# Patient Record
Sex: Male | Born: 1950 | Race: Black or African American | Hispanic: No | Smoking: Former smoker
Health system: Southern US, Community
[De-identification: ages and names within clinical notes are randomized; demographics above are authoritative.]

## PROBLEM LIST (undated history)

## (undated) DIAGNOSIS — E785 Hyperlipidemia, unspecified: Secondary | ICD-10-CM

## (undated) DIAGNOSIS — J189 Pneumonia, unspecified organism: Secondary | ICD-10-CM

## (undated) DIAGNOSIS — E119 Type 2 diabetes mellitus without complications: Secondary | ICD-10-CM

## (undated) DIAGNOSIS — I10 Essential (primary) hypertension: Secondary | ICD-10-CM

## (undated) HISTORY — DX: Essential (primary) hypertension: I10

## (undated) HISTORY — DX: Type 2 diabetes mellitus without complications: E11.9

## (undated) HISTORY — DX: Hyperlipidemia, unspecified: E78.5

---

## 1999-02-19 ENCOUNTER — Ambulatory Visit (HOSPITAL_COMMUNITY): Admission: RE | Admit: 1999-02-19 | Discharge: 1999-02-19 | Payer: Self-pay | Admitting: Family Medicine

## 1999-11-03 ENCOUNTER — Ambulatory Visit (HOSPITAL_COMMUNITY): Admission: RE | Admit: 1999-11-03 | Discharge: 1999-11-03 | Payer: Self-pay | Admitting: Family Medicine

## 1999-11-03 ENCOUNTER — Encounter: Payer: Self-pay | Admitting: Family Medicine

## 2001-04-19 ENCOUNTER — Emergency Department (HOSPITAL_COMMUNITY): Admission: EM | Admit: 2001-04-19 | Discharge: 2001-04-19 | Payer: Self-pay | Admitting: Emergency Medicine

## 2001-04-19 ENCOUNTER — Encounter: Payer: Self-pay | Admitting: Emergency Medicine

## 2001-08-25 ENCOUNTER — Encounter: Payer: Self-pay | Admitting: Family Medicine

## 2001-08-25 ENCOUNTER — Ambulatory Visit (HOSPITAL_COMMUNITY): Admission: RE | Admit: 2001-08-25 | Discharge: 2001-08-25 | Payer: Self-pay | Admitting: Family Medicine

## 2004-08-31 ENCOUNTER — Ambulatory Visit (HOSPITAL_COMMUNITY): Admission: RE | Admit: 2004-08-31 | Discharge: 2004-08-31 | Payer: Self-pay | Admitting: Family Medicine

## 2007-06-25 ENCOUNTER — Encounter: Admission: RE | Admit: 2007-06-25 | Discharge: 2007-06-25 | Payer: Self-pay | Admitting: Family Medicine

## 2013-01-31 ENCOUNTER — Encounter (INDEPENDENT_AMBULATORY_CARE_PROVIDER_SITE_OTHER): Payer: Self-pay | Admitting: General Surgery

## 2013-01-31 ENCOUNTER — Ambulatory Visit (INDEPENDENT_AMBULATORY_CARE_PROVIDER_SITE_OTHER): Payer: BC Managed Care – PPO | Admitting: General Surgery

## 2013-01-31 ENCOUNTER — Encounter (INDEPENDENT_AMBULATORY_CARE_PROVIDER_SITE_OTHER): Payer: Self-pay

## 2013-01-31 VITALS — BP 120/70 | HR 66 | Temp 98.5°F | Resp 18 | Ht 69.0 in | Wt 152.0 lb

## 2013-01-31 DIAGNOSIS — L089 Local infection of the skin and subcutaneous tissue, unspecified: Secondary | ICD-10-CM

## 2013-01-31 DIAGNOSIS — L723 Sebaceous cyst: Secondary | ICD-10-CM

## 2013-01-31 NOTE — Progress Notes (Signed)
Chief Complaint  Patient presents with  . Abscess    HISTORY:  Jeffrey Stevens is a 62 y.o. male who presents to clinic with a mass on his L hip.  It has been there for several years but has become red and swollen over the past few weeks.  Denies fevers or LE swelling  Past Medical History  Diagnosis Date  . Diabetes mellitus without complication   . Hypertension        No past surgical history on file.    Current Outpatient Prescriptions  Medication Sig Dispense Refill  . aspirin 81 MG tablet Take 81 mg by mouth daily.      . Bromocriptine Mesylate (CYCLOSET) 0.8 MG TABS Take 4 tablets by mouth every morning.      . Insulin Glargine (LANTUS [place]) Inject 30 Units into the skin at bedtime.      Marland Kitchen lisinopril-hydrochlorothiazide (PRINZIDE,ZESTORETIC) 20-12.5 MG per tablet Take 1 tablet by mouth daily.      . niacin-simvastatin (SIMCOR) 500-20 MG 24 hr tablet Take 1 tablet by mouth at bedtime.      . Saxagliptin-Metformin (KOMBIGLYZE XR) 2.06-998 MG TB24 Take by mouth.       No current facility-administered medications for this visit.     No Known Allergies    No family history on file.    History   Social History  . Marital Status: Legally Separated    Spouse Name: N/A    Number of Children: N/A  . Years of Education: N/A   Social History Main Topics  . Smoking status: Current Every Day Smoker -- 1.00 packs/day  . Smokeless tobacco: None  . Alcohol Use: No  . Drug Use: No  . Sexual Activity: None   Other Topics Concern  . None   Social History Narrative  . None       REVIEW OF SYSTEMS - PERTINENT POSITIVES ONLY: Review of Systems - General ROS: negative for - chills or fever Respiratory ROS: no cough, shortness of breath, or wheezing Cardiovascular ROS: no chest pain or dyspnea on exertion Gastrointestinal ROS: no abdominal pain, change in bowel habits, or black or bloody stools Genito-Urinary ROS: no dysuria, trouble voiding, or hematuria  EXAM: Filed  Vitals:   01/31/13 1438  BP: 120/70  Pulse: 66  Temp: 98.5 F (36.9 C)  Resp: 18    General appearance: alert and cooperative Resp: clear to auscultation bilaterally Cardio: regular rate and rhythm GI: normal findings: soft, non-tender Extremities: edematous and erythematous mass on L hip with fluctuance  Procedure: I&D Surgeon: Maisie Fus Assistant: Glasepy After the risks and benefits were explained, verbal consent was obtained for above procedure  Anesthesia: none Diagnosis: infected sebaceous cyst Procedure: operative area was prepped and draped in usual sterile fashion.  Subcutaneous tissue was infused with lidocaine with epinephrine.  A cruciate incision was made an infected sebaceous cyst material was extruded from the wound. The edges of the incision were trimmed. The cavity was debrided. The total area was 1 x 1 cm area was packed with iodoform gauze and covered with sterile dressing   ASSESSMENT AND PLAN: Jeffrey Stevens is a 62 y.o. M with DM who presents to the office with a mass on his L hip.  This was debrided in the office. He tolerated this well. I will see him back in the office in about 3 weeks.  I've recommended that he continue the antibiotics were prescribed to him, given his diabetes.    [name]  Jesse Sans, MD Colon and Rectal Surgery / General Surgery [facility]      Visit Diagnoses: No diagnosis found.  Primary Care Physician: [name], MD

## 2013-01-31 NOTE — Patient Instructions (Signed)
Continue all antibiotics.     Home Instructions Following Incision and Drainage of Perirectal Abscess  Wound care - A dressing has been applied to control any bleeding or drainage immediately after your procedure.  You should remove the packing and dressing in 48h and rinse with warm soapy water.  Pack with 2x2 gauze moistened with water or saline.  Cover with a dry dressing.  Repeat daily.   -You can sit in a tub of warm water for 15-20 minutes at least twice a day.  This will help with healing, pain and discomfort. - A small amount of bleeding is to be expected.    Diet -Eat a regular diet.  Avoid foods that may constipate you or give you diarrhea.  Drink 6-8 glasses of water a day   Medication -Take pain medication as directed.  Do not drive or operate machinery if you are taking a prescription pain medication.   - We recommend Extra Strength Tylenol for mild to moderate pain.  This can be taken as instructed on the bottle.   - If you are given a prescription for antibiotics, take as instructed by your doctor until the entire course is completed  Activity Resume activities as tolerated beginning tomorrow.  Avoid strenuous activities or sports for one week.    Call the office if you have any questions.  Call IMMEDIATELY if you should develop persistent heavy rectal bleeding, increase in pain, difficulty urinating or fever greater than 100 F.

## 2013-02-01 ENCOUNTER — Telehealth (INDEPENDENT_AMBULATORY_CARE_PROVIDER_SITE_OTHER): Payer: Self-pay | Admitting: *Deleted

## 2013-02-01 NOTE — Telephone Encounter (Signed)
Jeffrey Stevens called in this morning to ask about removing the bandage since it is bleeding through. I explained that the dressing needs to stay in place for at least 48 hours to insure the wound clots and seals.  Explained that if they remove the dressing too soon it can pull off any clot.  Encouraged them to reinforce the dressing and wait until tomorrow until they change the dressing.  Jeffrey Stevens states understanding and agreeable at this time.

## 2013-02-21 HISTORY — PX: COLONOSCOPY: SHX174

## 2013-02-27 ENCOUNTER — Ambulatory Visit (INDEPENDENT_AMBULATORY_CARE_PROVIDER_SITE_OTHER): Payer: BC Managed Care – PPO | Admitting: General Surgery

## 2013-02-27 ENCOUNTER — Encounter (INDEPENDENT_AMBULATORY_CARE_PROVIDER_SITE_OTHER): Payer: Self-pay | Admitting: General Surgery

## 2013-02-27 VITALS — BP 138/70 | HR 90 | Temp 97.9°F | Resp 18 | Wt 151.0 lb

## 2013-02-27 DIAGNOSIS — L723 Sebaceous cyst: Secondary | ICD-10-CM

## 2013-02-27 NOTE — Patient Instructions (Signed)
We will schedule outpatient surgery in Feb to remove the masses

## 2013-02-27 NOTE — Progress Notes (Signed)
Jeffrey Stevens is a 63 y.o. male who is here for a follow up visit regarding His infected sebaceous cyst. This has healed well. He has finished his antibiotics. He has not had any more trouble. He does have another mass in the right suprapubic region with a bluish discoloration.  Objective: There were no vitals filed for this visit.  General appearance: alert and cooperative GI: normal findings: soft, non-tender Left hip mass healing well. Right anterior pubic mass with a bluish discoloration and partially mobile.  This does not appear to be a sebaceous cyst. I believe it is a lipoma.   Assessment and Plan: We discussed management of both of these masses. We discussed that sebaceous cysts can recur over time if they're not completely resected. He would like to have both of these masses resected and I think this is reasonable. We will plan on scheduling this next couple months as an outpatient. We discussed the risk of the procedure which are mainly bleeding and infection as well as a small risk of recurrence.    Vanita Panda.Amaranta Mehl C Jimeka Balan, MD [facility], [place] [phone]

## 2013-04-05 ENCOUNTER — Other Ambulatory Visit (INDEPENDENT_AMBULATORY_CARE_PROVIDER_SITE_OTHER): Payer: Self-pay | Admitting: *Deleted

## 2013-04-05 DIAGNOSIS — L723 Sebaceous cyst: Secondary | ICD-10-CM

## 2013-04-05 MED ORDER — HYDROCODONE-ACETAMINOPHEN 10-325 MG PO TABS: 1.0000 | ORAL_TABLET | ORAL | Status: DC | PRN

## 2013-05-08 ENCOUNTER — Telehealth (INDEPENDENT_AMBULATORY_CARE_PROVIDER_SITE_OTHER): Payer: Self-pay

## 2013-05-08 NOTE — Telephone Encounter (Signed)
Need to ask patient if he can move his appointment up to 2:45pm tomorrow with Dr. Maisie Fushomas.

## 2013-05-09 ENCOUNTER — Ambulatory Visit (INDEPENDENT_AMBULATORY_CARE_PROVIDER_SITE_OTHER): Payer: BC Managed Care – PPO | Admitting: General Surgery

## 2013-05-09 ENCOUNTER — Encounter (INDEPENDENT_AMBULATORY_CARE_PROVIDER_SITE_OTHER): Payer: Self-pay | Admitting: General Surgery

## 2013-05-09 VITALS — BP 110/78 | HR 66 | Resp 12 | Ht 69.0 in | Wt 155.0 lb

## 2013-05-09 DIAGNOSIS — Z9889 Other specified postprocedural states: Secondary | ICD-10-CM

## 2013-05-09 NOTE — Progress Notes (Signed)
Jeffrey Stevens is a 63 y.o. male who is status post an excision of sebaceous cyst and lipoma on 2/16.  He is doing well. He denies any pain. His wounds are healing without difficulty. His pathology was benign. Objective: Filed Vitals:   05/09/13 1618  BP: 110/78  Pulse: 66  Resp: 12    General appearance: alert and cooperative GI: normal findings: soft, non-tender  Incision: healing well   Assessment: s/p  There are no active problems to display for this patient.   Plan: Doing well. I have reviewed his pathology but it I was not able to give him a copy that day due to it not being scanned and appendectomy. We will call over to the pathologist office and have them scan it so that we may send it to the patient. Followup as needed.    Vanita Panda.Janele Lague C Dameir Gentzler, MD [facility], [place] [phone]   05/09/2013 4:28 PM

## 2013-05-09 NOTE — Patient Instructions (Signed)
Return to the office as needed 

## 2013-05-22 ENCOUNTER — Encounter (INDEPENDENT_AMBULATORY_CARE_PROVIDER_SITE_OTHER): Payer: BC Managed Care – PPO | Admitting: General Surgery

## 2013-05-22 ENCOUNTER — Telehealth: Payer: Self-pay | Admitting: Internal Medicine

## 2013-05-22 NOTE — Telephone Encounter (Signed)
C/D 05/22/13 for appt. 06/05/13 °

## 2013-05-22 NOTE — Telephone Encounter (Signed)
S/W PATIENT AND GAVE NEW PATIENT APPT FOR 04/15 @ 1:30 W/DR. MOHAMED.  REFERRING DR. Adrian SaranVEITA Stevens DX- ANEMIA WELCOME PACKET MAILED.

## 2013-06-04 ENCOUNTER — Other Ambulatory Visit: Payer: Self-pay | Admitting: Medical Oncology

## 2013-06-04 DIAGNOSIS — D649 Anemia, unspecified: Secondary | ICD-10-CM

## 2013-06-05 ENCOUNTER — Encounter: Payer: Self-pay | Admitting: Internal Medicine

## 2013-06-05 ENCOUNTER — Telehealth: Payer: Self-pay | Admitting: Internal Medicine

## 2013-06-05 ENCOUNTER — Ambulatory Visit (HOSPITAL_BASED_OUTPATIENT_CLINIC_OR_DEPARTMENT_OTHER): Payer: BC Managed Care – PPO

## 2013-06-05 ENCOUNTER — Ambulatory Visit (HOSPITAL_BASED_OUTPATIENT_CLINIC_OR_DEPARTMENT_OTHER): Payer: BC Managed Care – PPO | Admitting: Internal Medicine

## 2013-06-05 ENCOUNTER — Ambulatory Visit: Payer: BC Managed Care – PPO

## 2013-06-05 VITALS — BP 102/61 | HR 65 | Temp 98.4°F | Resp 18 | Ht 69.0 in | Wt 150.9 lb

## 2013-06-05 DIAGNOSIS — F17200 Nicotine dependence, unspecified, uncomplicated: Secondary | ICD-10-CM

## 2013-06-05 DIAGNOSIS — D539 Nutritional anemia, unspecified: Secondary | ICD-10-CM

## 2013-06-05 DIAGNOSIS — D649 Anemia, unspecified: Secondary | ICD-10-CM

## 2013-06-05 LAB — CBC WITH DIFFERENTIAL/PLATELET
BASO%: 0.5 % (ref 0.0–2.0)
BASOS ABS: 0 10*3/uL (ref 0.0–0.1)
EOS%: 3.3 % (ref 0.0–7.0)
Eosinophils Absolute: 0.2 10*3/uL (ref 0.0–0.5)
HCT: 27.3 % — ABNORMAL LOW (ref 38.4–49.9)
HEMOGLOBIN: 8.2 g/dL — AB (ref 13.0–17.1)
LYMPH#: 1.5 10*3/uL (ref 0.9–3.3)
LYMPH%: 24.8 % (ref 14.0–49.0)
MCH: 22.2 pg — ABNORMAL LOW (ref 27.2–33.4)
MCHC: 30 g/dL — AB (ref 32.0–36.0)
MCV: 73.8 fL — AB (ref 79.3–98.0)
MONO#: 0.4 10*3/uL (ref 0.1–0.9)
MONO%: 6 % (ref 0.0–14.0)
NEUT#: 4 10*3/uL (ref 1.5–6.5)
NEUT%: 65.4 % (ref 39.0–75.0)
PLATELETS: 220 10*3/uL (ref 140–400)
RBC: 3.7 10*6/uL — ABNORMAL LOW (ref 4.20–5.82)
RDW: 19.2 % — AB (ref 11.0–14.6)
WBC: 6.1 10*3/uL (ref 4.0–10.3)

## 2013-06-05 LAB — COMPREHENSIVE METABOLIC PANEL (CC13)
ALBUMIN: 3.9 g/dL (ref 3.5–5.0)
ALK PHOS: 57 U/L (ref 40–150)
ALT: 11 U/L (ref 0–55)
AST: 20 U/L (ref 5–34)
Anion Gap: 7 mEq/L (ref 3–11)
BILIRUBIN TOTAL: 0.69 mg/dL (ref 0.20–1.20)
BUN: 12.7 mg/dL (ref 7.0–26.0)
CALCIUM: 9.5 mg/dL (ref 8.4–10.4)
CHLORIDE: 105 meq/L (ref 98–109)
CO2: 26 mEq/L (ref 22–29)
Creatinine: 1.1 mg/dL (ref 0.7–1.3)
Glucose: 251 mg/dl — ABNORMAL HIGH (ref 70–140)
POTASSIUM: 4.1 meq/L (ref 3.5–5.1)
SODIUM: 139 meq/L (ref 136–145)
Total Protein: 7.4 g/dL (ref 6.4–8.3)

## 2013-06-05 MED ORDER — INTEGRA PLUS PO CAPS: 1.0000 | ORAL_CAPSULE | Freq: Every morning | ORAL | Status: DC

## 2013-06-05 NOTE — Progress Notes (Signed)
[facility] [phone]   Fax:([phone]  CONSULT NOTE  REFERRING PHYSICIAN: Dr. Renaye RakersVeita Bland  REASON FOR CONSULTATION:  63 years old African American male with persistent anemia  HPI Jackalyn Lombardurcell A Rone is a 63 y.o. male with past medical history significant for diabetes mellitus, hypertension and dyslipidemia. The patient was seen recently by his primary care physician for evaluation of his diabetes mellitus. Routine blood work including CBC on 05/15/2013 showed a hemoglobin of 8.8 and hematocrit 27.9% with MCV of 72.7. The CBC on 02/11/2013 showed hemoglobin was 9.8 with hematocrit of 32.0%. On 07/23/2012 his hemoglobin was 10.0 and hematocrit 31.6%. The patient was referred to me today for further evaluation and recommendation regarding his persistent anemia. He denied having any significant bleeding issues, bruises or ecchymosis. The patient had colonoscopy performed 2 months ago by Dr. Yancey FlemingsJohn Perry and it was unremarkable for any abnormalities. He complains of fatigue and shortness of breath with exertion but no dizzy spells. He denied having any significant chest pain but has mild cough with no hemoptysis. He does not take any supplemental medications. He denied having any significant weight loss or night sweats. Family history significant for diabetes mellitus in both father and mother. The patient is married and has 4 children and 3 stepchildren. He was accompanied by his wife Rosalita ChessmanSuzanne. He works as a Production designer, theatre/television/filmfork lift operator. He has a history of smoking one pack per day for around 50 years and unfortunately continues to smoke. I strongly encouraged him to quit smoking and offered him smoke cessation program. He has no history of alcohol or drug abuse. HPI  Past Medical History  Diagnosis Date  . Diabetes mellitus without complication   . Hypertension   . Dyslipidemia     History reviewed. No pertinent past surgical history.  Family History  Problem Relation  Age of Onset  . Diabetes Mother   . Diabetes Father     Social History History  Substance Use Topics  . Smoking status: Current Every Day Smoker -- 1.00 packs/day for 50 years  . Smokeless tobacco: Never Used  . Alcohol Use: No    No Known Allergies  Current Outpatient Prescriptions  Medication Sig Dispense Refill  . aspirin 81 MG tablet Take 81 mg by mouth daily.      . Bromocriptine Mesylate (CYCLOSET) 0.8 MG TABS Take 4 tablets by mouth every morning.      . Insulin Glargine (LANTUS [place]) Inject 30 Units into the skin at bedtime.      Marland Kitchen. lisinopril-hydrochlorothiazide (PRINZIDE,ZESTORETIC) 20-12.5 MG per tablet Take 1 tablet by mouth daily.      . Multiple Vitamin (MULTIVITAMIN) tablet Take 1 tablet by mouth daily.      . niacin-simvastatin (SIMCOR) 500-20 MG 24 hr tablet Take 1 tablet by mouth at bedtime.      . Saxagliptin-Metformin (KOMBIGLYZE XR) 2.06-998 MG TB24 Take by mouth.       No current facility-administered medications for this visit.    Review of Systems  Constitutional: positive for fatigue Eyes: negative Ears, nose, mouth, throat, and face: negative Respiratory: positive for dyspnea on exertion Cardiovascular: negative Gastrointestinal: negative Genitourinary:negative Integument/breast: negative Hematologic/lymphatic: negative Musculoskeletal:negative Neurological: negative Behavioral/Psych: negative Endocrine: negative Allergic/Immunologic: negative  Physical Exam  AVW:UJWJXRAL:alert, healthy, no distress, well nourished and well developed SKIN: skin color, texture, turgor are normal, no rashes or significant lesions HEAD: Normocephalic, No masses, lesions, tenderness or abnormalities EYES: normal, PERRLA EARS: External ears normal, Canals clear OROPHARYNX:no  exudate, no erythema and lips, buccal mucosa, and tongue normal  NECK: supple, no adenopathy, no JVD LYMPH:  no palpable lymphadenopathy, no hepatosplenomegaly LUNGS: clear to auscultation , and  palpation HEART: regular rate & rhythm, no murmurs and no gallops ABDOMEN:abdomen soft, non-tender, normal bowel sounds and no masses or organomegaly BACK: Back symmetric, no curvature., No CVA tenderness EXTREMITIES:no joint deformities, effusion, or inflammation, no edema, no skin discoloration  NEURO: alert & oriented x 3 with fluent speech, no focal motor/sensory deficits  PERFORMANCE STATUS: ECOG 0  LABORATORY DATA: Lab Results  Component Value Date   WBC 6.1 06/05/2013   HGB 8.2* 06/05/2013   HCT 27.3* 06/05/2013   MCV 73.8* 06/05/2013   PLT 220 06/05/2013      Chemistry      Component Value Date/Time   NA 139 06/05/2013 1325   K 4.1 06/05/2013 1325   CO2 26 06/05/2013 1325   BUN 12.7 06/05/2013 1325   CREATININE 1.1 06/05/2013 1325      Component Value Date/Time   CALCIUM 9.5 06/05/2013 1325   ALKPHOS 57 06/05/2013 1325   AST 20 06/05/2013 1325   ALT 11 06/05/2013 1325   BILITOT 0.69 06/05/2013 1325       RADIOGRAPHIC STUDIES: No results found.  ASSESSMENT: This is a very pleasant 63 years old African American male with persistent microcytic anemia most likely secondary to iron deficiency. The patient has no bleeding, bruises or ecchymosis.  PLAN: I have a lengthy discussion with the patient today about his current disease status and treatment options. I ordered several studies today to identify the etiology of his anemia including repeat CBC, comprehensive metabolic panel, LDH, iron study, ferritin, serum folate, serum B12 level, serum erythropoietin as well as serum protein electrophoreses. I will start the patient on iron tablets, Integra plus 1 capsule by mouth daily. I will see him back for followup visit in one month's for reevaluation and discussion of his lab results and further recommendation regarding treatment of his condition. His improvement in his anemia response to the oral iron tablets, I may consider the patient for intravenous iron infusion with Feraheme. The  patient was advised to call immediately if he has any concerning symptoms in the interval. The patient voices understanding of current disease status and treatment options and is in agreement with the current care plan.  All questions were answered. The patient knows to call the clinic with any problems, questions or concerns. We can certainly see the patient much sooner if necessary.  Thank you so much for allowing me to participate in the care of Ryland GroupPurcell A Dekay. I will continue to follow up the patient with you and assist in his care.  I spent 35 minutes counseling the patient face to face. The total time spent in the appointment was 55 minutes.  Disclaimer: This note was dictated with voice recognition software. Similar sounding words can inadvertently be transcribed and may not be corrected upon review.   Si GaulMohamed Kadejah Sandiford 06/05/2013, 3:20 PM

## 2013-06-05 NOTE — Patient Instructions (Signed)
Smoking Cessation, Tips for Success If you are ready to quit smoking, congratulations! You have chosen to help yourself be healthier. Cigarettes bring nicotine, tar, carbon monoxide, and other irritants into your body. Your lungs, heart, and blood vessels will be able to work better without these poisons. There are many different ways to quit smoking. Nicotine gum, nicotine patches, a nicotine inhaler, or nicotine nasal spray can help with physical craving. Hypnosis, support groups, and medicines help break the habit of smoking. WHAT THINGS CAN I DO TO MAKE QUITTING EASIER?  Here are some tips to help you quit for good:  Pick a date when you will quit smoking completely. Tell all of your friends and family about your plan to quit on that date.  Do not try to slowly cut down on the number of cigarettes you are smoking. Pick a quit date and quit smoking completely starting on that day.  Throw away all cigarettes.   Clean and remove all ashtrays from your home, work, and car.   On a card, write down your reasons for quitting. Carry the card with you and read it when you get the urge to smoke.   Cleanse your body of nicotine. Drink enough water and fluids to keep your urine clear or pale yellow. Do this after quitting to flush the nicotine from your body.   Learn to predict your moods. Do not let a bad situation be your excuse to have a cigarette. Some situations in your life might tempt you into wanting a cigarette.   Never have "just one" cigarette. It leads to wanting another and another. Remind yourself of your decision to quit.   Change habits associated with smoking. If you smoked while driving or when feeling stressed, try other activities to replace smoking. Stand up when drinking your coffee. Brush your teeth after eating. Sit in a different chair when you read the paper. Avoid alcohol while trying to quit, and try to drink fewer caffeinated beverages. Alcohol and caffeine may urge  you to smoke.   Avoid foods and drinks that can trigger a desire to smoke, such as sugary or spicy foods and alcohol.   Ask people who smoke not to smoke around you.   Have something planned to do right after eating or having a cup of coffee. For example, plan to take a walk or exercise.   Try a relaxation exercise to calm you down and decrease your stress. Remember, you may be tense and nervous for the first 2 weeks after you quit, but this will pass.   Find new activities to keep your hands busy. Play with a pen, coin, or rubber band. Doodle or draw things on paper.   Brush your teeth right after eating. This will help cut down on the craving for the taste of tobacco after meals. You can also try mouthwash.   Use oral substitutes in place of cigarettes. Try using lemon drops, carrots, cinnamon sticks, or chewing gum. Keep them handy so they are available when you have the urge to smoke.   When you have the urge to smoke, try deep breathing.   Designate your home as a nonsmoking area.   If you are a heavy smoker, ask your health care provider about a prescription for nicotine chewing gum. It can ease your withdrawal from nicotine.   Reward yourself. Set aside the cigarette money you save and buy yourself something nice.   Look for support from others. Join a support group or   smoking cessation program. Ask someone at home or at work to help you with your plan to quit smoking.   Always ask yourself, "Do I need this cigarette or is this just a reflex?" Tell yourself, "Today, I choose not to smoke," or "I do not want to smoke." You are reminding yourself of your decision to quit.  Do not replace cigarette smoking with electronic cigarettes (commonly called e-cigarettes). The safety of e-cigarettes is unknown, and some may contain harmful chemicals.  If you relapse, do not give up! Plan ahead and think about what you will do the next time you get the urge to smoke.  HOW WILL  I FEEL WHEN I QUIT SMOKING? You may have symptoms of withdrawal because your body is used to nicotine (the addictive substance in cigarettes). You may crave cigarettes, be irritable, feel very hungry, cough often, get headaches, or have difficulty concentrating. The withdrawal symptoms are only temporary. They are strongest when you first quit but will go away within 10 14 days. When withdrawal symptoms occur, stay in control. Think about your reasons for quitting. Remind yourself that these are signs that your body is healing and getting used to being without cigarettes. Remember that withdrawal symptoms are easier to treat than the major diseases that smoking can cause.  Even after the withdrawal is over, expect periodic urges to smoke. However, these cravings are generally short lived and will go away whether you smoke or not. Do not smoke!  WHAT RESOURCES ARE AVAILABLE TO HELP ME QUIT SMOKING? Your health care provider can direct you to community resources or hospitals for support, which may include:  Group support.  Education.  Hypnosis.  Therapy. Document Released: 11/06/2003 Document Revised: 11/28/2012 Document Reviewed: 07/26/2012 [employer] Patient Information 2014 [employer].  

## 2013-06-05 NOTE — Telephone Encounter (Signed)
Gave pt appt for lab and MD for May 2015 °

## 2013-06-05 NOTE — Progress Notes (Signed)
Checked in new patient with no financial issues. He has not been out of country. °

## 2013-06-06 LAB — IRON AND TIBC CHCC
%SAT: 4 % — ABNORMAL LOW (ref 20–55)
IRON: 17 ug/dL — AB (ref 42–163)
TIBC: 434 ug/dL — ABNORMAL HIGH (ref 202–409)
UIBC: 417 ug/dL — ABNORMAL HIGH (ref 117–376)

## 2013-06-06 LAB — FERRITIN CHCC: FERRITIN: 8 ng/mL — AB (ref 22–316)

## 2013-06-07 LAB — PROTEIN ELECTROPHORESIS, SERUM, WITH REFLEX
ALPHA-1-GLOBULIN: 5.9 % — AB (ref 2.9–4.9)
ALPHA-2-GLOBULIN: 9.8 % (ref 7.1–11.8)
Albumin ELP: 54.9 % — ABNORMAL LOW (ref 55.8–66.1)
BETA GLOBULIN: 8.3 % — AB (ref 4.7–7.2)
Beta 2: 5.7 % (ref 3.2–6.5)
GAMMA GLOBULIN: 15.4 % (ref 11.1–18.8)
TOTAL PROTEIN, SERUM ELECTROPHOR: 7.5 g/dL (ref 6.0–8.3)

## 2013-06-07 LAB — VITAMIN B12: Vitamin B-12: 639 pg/mL (ref 211–911)

## 2013-06-07 LAB — IGG, IGA, IGM
IGA: 481 mg/dL — AB (ref 68–379)
IgG (Immunoglobin G), Serum: 1160 mg/dL (ref 650–1600)
IgM, Serum: 96 mg/dL (ref 41–251)

## 2013-06-07 LAB — FOLATE

## 2013-06-07 LAB — IFE INTERPRETATION

## 2013-06-07 LAB — ERYTHROPOIETIN: Erythropoietin: 208.4 m[IU]/mL — ABNORMAL HIGH (ref 2.6–18.5)

## 2013-07-09 ENCOUNTER — Telehealth: Payer: Self-pay | Admitting: Internal Medicine

## 2013-07-09 ENCOUNTER — Other Ambulatory Visit (HOSPITAL_BASED_OUTPATIENT_CLINIC_OR_DEPARTMENT_OTHER): Payer: BC Managed Care – PPO

## 2013-07-09 ENCOUNTER — Ambulatory Visit (HOSPITAL_BASED_OUTPATIENT_CLINIC_OR_DEPARTMENT_OTHER): Payer: BC Managed Care – PPO | Admitting: Internal Medicine

## 2013-07-09 ENCOUNTER — Encounter: Payer: Self-pay | Admitting: Internal Medicine

## 2013-07-09 VITALS — BP 110/68 | HR 67 | Temp 98.3°F | Resp 18 | Ht 69.0 in | Wt 155.0 lb

## 2013-07-09 DIAGNOSIS — I10 Essential (primary) hypertension: Secondary | ICD-10-CM

## 2013-07-09 DIAGNOSIS — E119 Type 2 diabetes mellitus without complications: Secondary | ICD-10-CM

## 2013-07-09 DIAGNOSIS — D539 Nutritional anemia, unspecified: Secondary | ICD-10-CM

## 2013-07-09 DIAGNOSIS — D509 Iron deficiency anemia, unspecified: Secondary | ICD-10-CM

## 2013-07-09 LAB — CBC WITH DIFFERENTIAL/PLATELET
BASO%: 1.7 % (ref 0.0–2.0)
Basophils Absolute: 0.1 10*3/uL (ref 0.0–0.1)
EOS%: 3.7 % (ref 0.0–7.0)
Eosinophils Absolute: 0.2 10*3/uL (ref 0.0–0.5)
HEMATOCRIT: 31.9 % — AB (ref 38.4–49.9)
HEMOGLOBIN: 10 g/dL — AB (ref 13.0–17.1)
LYMPH#: 1.8 10*3/uL (ref 0.9–3.3)
LYMPH%: 31.9 % (ref 14.0–49.0)
MCH: 24.8 pg — AB (ref 27.2–33.4)
MCHC: 31.2 g/dL — AB (ref 32.0–36.0)
MCV: 79.3 fL (ref 79.3–98.0)
MONO#: 0.6 10*3/uL (ref 0.1–0.9)
MONO%: 10.1 % (ref 0.0–14.0)
NEUT#: 3.1 10*3/uL (ref 1.5–6.5)
NEUT%: 52.6 % (ref 39.0–75.0)
PLATELETS: 218 10*3/uL (ref 140–400)
RBC: 4.02 10*6/uL — AB (ref 4.20–5.82)
RDW: 28.5 % — ABNORMAL HIGH (ref 11.0–14.6)
WBC: 5.8 10*3/uL (ref 4.0–10.3)

## 2013-07-09 NOTE — Progress Notes (Signed)
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 male returns to the clinic today for followup visit accompanied by his wife. The patient is feeling much better today with less fatigue and weakness after starting Integra plus. He denied having any dizzy spells. He denied having any chest pain, shortness breath, cough or hemoptysis. He is tolerating Integra plus fairly well with no significant adverse effects. He denied having any significant bleeding issues, bruises or ecchymosis. He has repeat CBC performed earlier today and he is here for evaluation and discussion of his lab results.  MEDICAL HISTORY: Past Medical History  Diagnosis Date  . Diabetes mellitus without complication   . Hypertension   . Dyslipidemia     ALLERGIES:  has No Known Allergies.  MEDICATIONS:  Current Outpatient Prescriptions  Medication Sig Dispense Refill  . aspirin 81 MG tablet Take 81 mg by mouth daily.      . Bromocriptine Mesylate (CYCLOSET) 0.8 MG TABS Take 4 tablets by mouth every morning.      Marland Kitchen. FeFum-FePoly-FA-B Cmp-C-Biot (INTEGRA PLUS) CAPS Take 1 capsule by mouth every morning.  30 capsule  2  . Insulin Glargine (LANTUS [place]) Inject 30 Units into the skin at bedtime.      Marland Kitchen. lisinopril-hydrochlorothiazide (PRINZIDE,ZESTORETIC) 20-12.5 MG per tablet Take 1 tablet by mouth daily.      . Multiple Vitamin (MULTIVITAMIN) tablet Take 1 tablet by mouth daily.      . niacin-simvastatin (SIMCOR) 500-20 MG 24 hr tablet Take 1 tablet by mouth at bedtime.      . Saxagliptin-Metformin (KOMBIGLYZE XR) 2.06-998 MG TB24 Take by mouth.       No current facility-administered medications for this visit.   REVIEW OF SYSTEMS:   A comprehensive review of systems was negative.   PHYSICAL EXAMINATION: General appearance: alert, cooperative and no distress Head: Normocephalic, without obvious abnormality, atraumatic Neck: no adenopathy, no JVD, supple, symmetrical, trachea midline and thyroid not enlarged, symmetric, no tenderness/mass/nodules Lymph nodes: Cervical, supraclavicular, and axillary nodes normal. Resp: clear to auscultation bilaterally Back: symmetric, no curvature. ROM normal. No CVA tenderness. Cardio: regular rate and rhythm, S1, S2 normal, no murmur, click, rub or gallop GI: soft, non-tender; bowel sounds normal; no masses,  no organomegaly Extremities: extremities normal, atraumatic, no cyanosis or edema  ECOG PERFORMANCE STATUS: 0 - Asymptomatic  Blood pressure 110/68, pulse 67, temperature 98.3 F (36.8 C), temperature source Oral, resp. rate 18, height 5\' 9"  (1.753 m), weight 155 lb (70.308 kg).  LABORATORY DATA: Lab Results  Component Value Date   WBC 5.8 07/09/2013   HGB 10.0* 07/09/2013   HCT 31.9* 07/09/2013   MCV 79.3 07/09/2013   PLT 218 07/09/2013      Chemistry      Component Value Date/Time   NA 139 06/05/2013 1325   K 4.1 06/05/2013 1325   CO2 26 06/05/2013 1325   BUN 12.7 06/05/2013 1325   CREATININE 1.1 06/05/2013 1325      Component Value Date/Time   CALCIUM 9.5 06/05/2013 1325   ALKPHOS 57 06/05/2013 1325   AST 20 06/05/2013 1325   ALT 11 06/05/2013 1325   BILITOT 0.69 06/05/2013 1325  RADIOGRAPHIC STUDIES: No results found.  ASSESSMENT AND PLAN: This very pleasant 63 years old African American male with microcytic anemia secondary to iron deficiency of unknown etiology. He is currently on treatment with Integra plus 1 capsule by mouth daily and tolerating it fairly well. He has improvement in his hemoglobin and hematocrit over the last few weeks. I recommended for him to continue the same treatment treatment with with Integra plus. I would see him back for followup  visit in 2 months with repeat CBC and iron study. He was advised to call immediately if he has any concerning symptoms in the interval. The patient voices understanding of current disease status and treatment options and is in agreement with the current care plan.  All questions were answered. The patient knows to call the clinic with any problems, questions or concerns. We can certainly see the patient much sooner if necessary.  Disclaimer: This note was dictated with voice recognition software. Similar sounding words can inadvertently be transcribed and may not be corrected upon review.

## 2013-07-09 NOTE — Telephone Encounter (Signed)
gv adn printed apts sched adn avs for pt for July

## 2013-08-27 ENCOUNTER — Other Ambulatory Visit (HOSPITAL_BASED_OUTPATIENT_CLINIC_OR_DEPARTMENT_OTHER): Payer: BC Managed Care – PPO

## 2013-08-27 DIAGNOSIS — D539 Nutritional anemia, unspecified: Secondary | ICD-10-CM

## 2013-08-27 LAB — CBC WITH DIFFERENTIAL/PLATELET
BASO%: 1.1 % (ref 0.0–2.0)
Basophils Absolute: 0.1 10*3/uL (ref 0.0–0.1)
EOS ABS: 0.2 10*3/uL (ref 0.0–0.5)
EOS%: 4.2 % (ref 0.0–7.0)
HCT: 37.2 % — ABNORMAL LOW (ref 38.4–49.9)
HEMOGLOBIN: 11.9 g/dL — AB (ref 13.0–17.1)
LYMPH#: 1.8 10*3/uL (ref 0.9–3.3)
LYMPH%: 33.2 % (ref 14.0–49.0)
MCH: 27.5 pg (ref 27.2–33.4)
MCHC: 32 g/dL (ref 32.0–36.0)
MCV: 85.9 fL (ref 79.3–98.0)
MONO#: 0.5 10*3/uL (ref 0.1–0.9)
MONO%: 9.9 % (ref 0.0–14.0)
NEUT#: 2.9 10*3/uL (ref 1.5–6.5)
NEUT%: 51.6 % (ref 39.0–75.0)
Platelets: 186 10*3/uL (ref 140–400)
RBC: 4.32 10*6/uL (ref 4.20–5.82)
RDW: 25.7 % — ABNORMAL HIGH (ref 11.0–14.6)
WBC: 5.6 10*3/uL (ref 4.0–10.3)

## 2013-08-28 LAB — IRON AND TIBC CHCC
%SAT: 17 % — ABNORMAL LOW (ref 20–55)
IRON: 59 ug/dL (ref 42–163)
TIBC: 351 ug/dL (ref 202–409)
UIBC: 292 ug/dL (ref 117–376)

## 2013-08-28 LAB — FERRITIN CHCC: Ferritin: 15 ng/ml — ABNORMAL LOW (ref 22–316)

## 2013-09-03 ENCOUNTER — Ambulatory Visit (HOSPITAL_BASED_OUTPATIENT_CLINIC_OR_DEPARTMENT_OTHER): Payer: BC Managed Care – PPO | Admitting: Internal Medicine

## 2013-09-03 ENCOUNTER — Encounter: Payer: Self-pay | Admitting: Internal Medicine

## 2013-09-03 ENCOUNTER — Telehealth: Payer: Self-pay | Admitting: Internal Medicine

## 2013-09-03 VITALS — BP 105/60 | HR 78 | Temp 98.4°F | Resp 18 | Ht 69.0 in | Wt 150.9 lb

## 2013-09-03 DIAGNOSIS — D539 Nutritional anemia, unspecified: Secondary | ICD-10-CM

## 2013-09-03 NOTE — Progress Notes (Signed)
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 male returns to the clinic today for followup visit accompanied by his wife. The patient continues to do well and tolerating his treatment with Integra plus fairly well with no significant adverse effects. He denied having any fatigue or dizzy spells. He denied having any chest pain, shortness breath, cough or hemoptysis. He denied having any significant bleeding issues, bruises or ecchymosis. He has repeat CBC performed and iron study performed recently and he is here for evaluation and discussion of his lab results.  MEDICAL HISTORY: Past Medical History  Diagnosis Date  . Diabetes mellitus without complication   . Hypertension   . Dyslipidemia     ALLERGIES:  has No Known Allergies.  MEDICATIONS:  Current Outpatient Prescriptions  Medication Sig Dispense Refill  . aspirin 81 MG tablet Take 81 mg by mouth daily.      . Bromocriptine Mesylate (CYCLOSET) 0.8 MG TABS Take 4 tablets by mouth every morning.      Marland Kitchen FeFum-FePoly-FA-B Cmp-C-Biot (INTEGRA PLUS) CAPS Take 1 capsule by mouth every morning.  30 capsule  2  . Insulin Glargine (LANTUS [place]) Inject 30 Units into the skin at bedtime.      Marland Kitchen lisinopril-hydrochlorothiazide (PRINZIDE,ZESTORETIC) 20-12.5 MG per tablet Take 1 tablet by mouth daily.      . Multiple Vitamin (MULTIVITAMIN) tablet Take 1 tablet by mouth daily.      . niacin-simvastatin (SIMCOR) 500-20 MG 24 hr tablet Take 1 tablet by mouth at bedtime.      . Saxagliptin-Metformin (KOMBIGLYZE XR) 2.06-998 MG TB24 Take by mouth.       No current facility-administered medications for this visit.   REVIEW OF SYSTEMS:  A comprehensive  review of systems was negative.   PHYSICAL EXAMINATION: General appearance: alert, cooperative and no distress Head: Normocephalic, without obvious abnormality, atraumatic Neck: no adenopathy, no JVD, supple, symmetrical, trachea midline and thyroid not enlarged, symmetric, no tenderness/mass/nodules Lymph nodes: Cervical, supraclavicular, and axillary nodes normal. Resp: clear to auscultation bilaterally Back: symmetric, no curvature. ROM normal. No CVA tenderness. Cardio: regular rate and rhythm, S1, S2 normal, no murmur, click, rub or gallop GI: soft, non-tender; bowel sounds normal; no masses,  no organomegaly Extremities: extremities normal, atraumatic, no cyanosis or edema  ECOG PERFORMANCE STATUS: 0 - Asymptomatic  Blood pressure 105/60, pulse 78, temperature 98.4 F (36.9 C), temperature source Oral, resp. rate 18, height 5\' 9"  (1.753 m), weight 150 lb 14.4 oz (68.448 kg).  LABORATORY DATA: Lab Results  Component Value Date   WBC 5.6 08/27/2013   HGB 11.9* 08/27/2013   HCT 37.2* 08/27/2013   MCV 85.9 08/27/2013   PLT 186 08/27/2013      Chemistry      Component Value Date/Time   NA 139 06/05/2013 1325   K 4.1 06/05/2013 1325   CO2 26 06/05/2013 1325   BUN 12.7 06/05/2013 1325   CREATININE 1.1 06/05/2013 1325      Component Value Date/Time   CALCIUM 9.5 06/05/2013 1325   ALKPHOS 57 06/05/2013 1325   AST 20 06/05/2013 1325   ALT 11 06/05/2013 1325   BILITOT 0.69 06/05/2013 1325       RADIOGRAPHIC  STUDIES: No results found.  ASSESSMENT AND PLAN: This very pleasant 63 years old African American male with microcytic anemia secondary to iron deficiency of unknown etiology. He is currently on treatment with Integra plus 1 capsule by mouth daily and tolerating it fairly well. He continues to have improvement in his anemia parameters. I recommended for him to continue the same treatment treatment with with Integra plus. I would see him back for followup visit in 3 months with repeat  CBC and iron study. He was advised to call immediately if he has any concerning symptoms in the interval. The patient voices understanding of current disease status and treatment options and is in agreement with the current care plan.  All questions were answered. The patient knows to call the clinic with any problems, questions or concerns. We can certainly see the patient much sooner if necessary.  Disclaimer: This note was dictated with voice recognition software. Similar sounding words can inadvertently be transcribed and may not be corrected upon review.

## 2013-09-03 NOTE — Telephone Encounter (Signed)
gv and printed appt sched and avs for pt for OCT. °

## 2013-09-03 NOTE — Patient Instructions (Signed)
Smoking Cessation Quitting smoking is important to your health and has many advantages. However, it is not always easy to quit since nicotine is a very addictive drug. Often times, people try 3 times or more before being able to quit. This document explains the best ways for you to prepare to quit smoking. Quitting takes hard work and a lot of effort, but you can do it. ADVANTAGES OF QUITTING SMOKING  You will live longer, feel better, and live better.  Your body will feel the impact of quitting smoking almost immediately.  Within 20 minutes, blood pressure decreases. Your pulse returns to its normal level.  After 8 hours, carbon monoxide levels in the blood return to normal. Your oxygen level increases.  After 24 hours, the chance of having a heart attack starts to decrease. Your breath, hair, and body stop smelling like smoke.  After 48 hours, damaged nerve endings begin to recover. Your sense of taste and smell improve.  After 72 hours, the body is virtually free of nicotine. Your bronchial tubes relax and breathing becomes easier.  After 2 to 12 weeks, lungs can hold more air. Exercise becomes easier and circulation improves.  The risk of having a heart attack, stroke, cancer, or lung disease is greatly reduced.  After 1 year, the risk of coronary heart disease is cut in half.  After 5 years, the risk of stroke falls to the same as a nonsmoker.  After 10 years, the risk of lung cancer is cut in half and the risk of other cancers decreases significantly.  After 15 years, the risk of coronary heart disease drops, usually to the level of a nonsmoker.  If you are pregnant, quitting smoking will improve your chances of having a healthy baby.  The people you live with, especially any children, will be healthier.  You will have extra money to spend on things other than cigarettes. QUESTIONS TO THINK ABOUT BEFORE ATTEMPTING TO QUIT You may want to talk about your answers with your  caregiver.  Why do you want to quit?  If you tried to quit in the past, what helped and what did not?  What will be the most difficult situations for you after you quit? How will you plan to handle them?  Who can help you through the tough times? Your family? Friends? A caregiver?  What pleasures do you get from smoking? What ways can you still get pleasure if you quit? Here are some questions to ask your caregiver:  How can you help me to be successful at quitting?  What medicine do you think would be best for me and how should I take it?  What should I do if I need more help?  What is smoking withdrawal like? How can I get information on withdrawal? GET READY  Set a quit date.  Change your environment by getting rid of all cigarettes, ashtrays, matches, and lighters in your home, car, or work. Do not let people smoke in your home.  Review your past attempts to quit. Think about what worked and what did not. GET SUPPORT AND ENCOURAGEMENT You have a better chance of being successful if you have help. You can get support in many ways.  Tell your family, friends, and co-workers that you are going to quit and need their support. Ask them not to smoke around you.  Get individual, group, or telephone counseling and support. Programs are available at local hospitals and health centers. Call your local health department for   information about programs in your area.  Spiritual beliefs and practices may help some smokers quit.  Download a "quit meter" on your computer to keep track of quit statistics, such as how long you have gone without smoking, cigarettes not smoked, and money saved.  Get a self-help book about quitting smoking and staying off of tobacco. LEARN NEW SKILLS AND BEHAVIORS  Distract yourself from urges to smoke. Talk to someone, go for a walk, or occupy your time with a task.  Change your normal routine. Take a different route to work. Drink tea instead of coffee.  Eat breakfast in a different place.  Reduce your stress. Take a hot bath, exercise, or read a book.  Plan something enjoyable to do every day. Reward yourself for not smoking.  Explore interactive web-based programs that specialize in helping you quit. GET MEDICINE AND USE IT CORRECTLY Medicines can help you stop smoking and decrease the urge to smoke. Combining medicine with the above behavioral methods and support can greatly increase your chances of successfully quitting smoking.  Nicotine replacement therapy helps deliver nicotine to your body without the negative effects and risks of smoking. Nicotine replacement therapy includes nicotine gum, lozenges, inhalers, nasal sprays, and skin patches. Some may be available over-the-counter and others require a prescription.  Antidepressant medicine helps people abstain from smoking, but how this works is unknown. This medicine is available by prescription.  Nicotinic receptor partial agonist medicine simulates the effect of nicotine in your brain. This medicine is available by prescription. Ask your caregiver for advice about which medicines to use and how to use them based on your health history. Your caregiver will tell you what side effects to look out for if you choose to be on a medicine or therapy. Carefully read the information on the package. Do not use any other product containing nicotine while using a nicotine replacement product.  RELAPSE OR DIFFICULT SITUATIONS Most relapses occur within the first 3 months after quitting. Do not be discouraged if you start smoking again. Remember, most people try several times before finally quitting. You may have symptoms of withdrawal because your body is used to nicotine. You may crave cigarettes, be irritable, feel very hungry, cough often, get headaches, or have difficulty concentrating. The withdrawal symptoms are only temporary. They are strongest when you first quit, but they will go away within  10-14 days. To reduce the chances of relapse, try to:  Avoid drinking alcohol. Drinking lowers your chances of successfully quitting.  Reduce the amount of caffeine you consume. Once you quit smoking, the amount of caffeine in your body increases and can give you symptoms, such as a rapid heartbeat, sweating, and anxiety.  Avoid smokers because they can make you want to smoke.  Do not let weight gain distract you. Many smokers will gain weight when they quit, usually less than 10 pounds. Eat a healthy diet and stay active. You can always lose the weight gained after you quit.  Find ways to improve your mood other than smoking. FOR MORE INFORMATION  www.smokefree.gov  Document Released: 02/01/2001 Document Revised: 08/09/2011 Document Reviewed: 05/19/2011 Exit[employer] Patient Information 2015 [employer]. This information is not intended to replace advice given to you by your health care provider. Make sure you discuss any questions you have with your health care provider.  

## 2013-09-05 ENCOUNTER — Other Ambulatory Visit: Payer: Self-pay | Admitting: Internal Medicine

## 2013-09-05 DIAGNOSIS — D539 Nutritional anemia, unspecified: Secondary | ICD-10-CM

## 2013-09-30 ENCOUNTER — Other Ambulatory Visit: Payer: Self-pay | Admitting: Internal Medicine

## 2013-11-22 ENCOUNTER — Other Ambulatory Visit (HOSPITAL_BASED_OUTPATIENT_CLINIC_OR_DEPARTMENT_OTHER): Payer: BC Managed Care – PPO

## 2013-11-22 DIAGNOSIS — D509 Iron deficiency anemia, unspecified: Secondary | ICD-10-CM

## 2013-11-22 DIAGNOSIS — D539 Nutritional anemia, unspecified: Secondary | ICD-10-CM

## 2013-11-22 LAB — CBC WITH DIFFERENTIAL/PLATELET
BASO%: 0.5 % (ref 0.0–2.0)
Basophils Absolute: 0 10*3/uL (ref 0.0–0.1)
EOS ABS: 0.2 10*3/uL (ref 0.0–0.5)
EOS%: 3 % (ref 0.0–7.0)
HEMATOCRIT: 38.1 % — AB (ref 38.4–49.9)
HEMOGLOBIN: 12.6 g/dL — AB (ref 13.0–17.1)
LYMPH#: 1.8 10*3/uL (ref 0.9–3.3)
LYMPH%: 30.5 % (ref 14.0–49.0)
MCH: 30.4 pg (ref 27.2–33.4)
MCHC: 33.1 g/dL (ref 32.0–36.0)
MCV: 92 fL (ref 79.3–98.0)
MONO#: 0.4 10*3/uL (ref 0.1–0.9)
MONO%: 6.3 % (ref 0.0–14.0)
NEUT%: 59.7 % (ref 39.0–75.0)
NEUTROS ABS: 3.6 10*3/uL (ref 1.5–6.5)
Platelets: 160 10*3/uL (ref 140–400)
RBC: 4.14 10*6/uL — AB (ref 4.20–5.82)
RDW: 16.8 % — ABNORMAL HIGH (ref 11.0–14.6)
WBC: 6 10*3/uL (ref 4.0–10.3)

## 2013-11-22 LAB — FERRITIN CHCC: FERRITIN: 19 ng/mL — AB (ref 22–316)

## 2013-11-22 LAB — IRON AND TIBC CHCC
%SAT: 33 % (ref 20–55)
IRON: 107 ug/dL (ref 42–163)
TIBC: 320 ug/dL (ref 202–409)
UIBC: 213 ug/dL (ref 117–376)

## 2013-11-27 ENCOUNTER — Ambulatory Visit (HOSPITAL_BASED_OUTPATIENT_CLINIC_OR_DEPARTMENT_OTHER): Payer: BC Managed Care – PPO | Admitting: Internal Medicine

## 2013-11-27 ENCOUNTER — Encounter: Payer: Self-pay | Admitting: Internal Medicine

## 2013-11-27 ENCOUNTER — Telehealth: Payer: Self-pay | Admitting: Internal Medicine

## 2013-11-27 VITALS — BP 111/58 | HR 81 | Temp 98.0°F | Resp 18 | Ht 69.0 in | Wt 149.1 lb

## 2013-11-27 DIAGNOSIS — D539 Nutritional anemia, unspecified: Secondary | ICD-10-CM

## 2013-11-27 DIAGNOSIS — D509 Iron deficiency anemia, unspecified: Secondary | ICD-10-CM

## 2013-11-27 NOTE — Progress Notes (Signed)
[facility] Telephone:(336) [phone]   Fax:(336) [phone]  OFFICE PROGRESS NOTE  [name], MD [address] [place] [address]  DIAGNOSIS: Microcytic anemia secondary to iron deficiency  PRIOR THERAPY: None  CURRENT THERAPY: Integra plus 1 capsule by mouth daily  INTERVAL HISTORY: Jeffrey Stevens 63 y.o. male returns to the clinic today for followup visit accompanied by his wife. He has no complaints today. He is tolerating his treatment with Integra plus fairly well with no significant adverse effects. He denied having any fatigue or dizzy spells. He denied having any chest pain, shortness of breath, cough or hemoptysis. He denied having any significant bleeding issues, bruises or ecchymosis. He has repeat CBC performed and iron study performed recently and he is here for evaluation and discussion of his lab results.  MEDICAL HISTORY: Past Medical History  Diagnosis Date  . Diabetes mellitus without complication   . Hypertension   . Dyslipidemia     ALLERGIES:  has No Known Allergies.  MEDICATIONS:  Current Outpatient Prescriptions  Medication Sig Dispense Refill  . ACCU-CHEK SOFTCLIX LANCETS lancets       . aspirin 81 MG tablet Take 81 mg by mouth daily.      . Bromocriptine Mesylate (CYCLOSET) 0.8 MG TABS Take 4 tablets by mouth every morning.      Marland Kitchen FeFum-FePoly-FA-B Cmp-C-Biot (INTEGRA PLUS) CAPS TAKE 1 CAPSULE BY MOUTH EVERY MORNING.  30 capsule  2  . Insulin Glargine (LANTUS [place]) Inject 30 Units into the skin at bedtime.      Marland Kitchen lisinopril-hydrochlorothiazide (PRINZIDE,ZESTORETIC) 20-12.5 MG per tablet Take 1 tablet by mouth daily.      . Multiple Vitamin (MULTIVITAMIN) tablet Take 1 tablet by mouth daily.      . niacin-simvastatin (SIMCOR) 500-20 MG 24 hr tablet Take 1 tablet by mouth at bedtime.      . ONE TOUCH ULTRA TEST test strip       . Saxagliptin-Metformin (KOMBIGLYZE XR) 2.06-998 MG TB24 Take by mouth.       No current  facility-administered medications for this visit.   REVIEW OF SYSTEMS:  A comprehensive review of systems was negative.   PHYSICAL EXAMINATION: General appearance: alert, cooperative and no distress Head: Normocephalic, without obvious abnormality, atraumatic Neck: no adenopathy, no JVD, supple, symmetrical, trachea midline and thyroid not enlarged, symmetric, no tenderness/mass/nodules Lymph nodes: Cervical, supraclavicular, and axillary nodes normal. Resp: clear to auscultation bilaterally Back: symmetric, no curvature. ROM normal. No CVA tenderness. Cardio: regular rate and rhythm, S1, S2 normal, no murmur, click, rub or gallop GI: soft, non-tender; bowel sounds normal; no masses,  no organomegaly Extremities: extremities normal, atraumatic, no cyanosis or edema  ECOG PERFORMANCE STATUS: 0 - Asymptomatic  Blood pressure 111/58, pulse 81, temperature 98 F (36.7 C), temperature source Oral, resp. rate 18, height 5\' 9"  (1.753 m), weight 149 lb 1.6 oz (67.631 kg), SpO2 100.00%.  LABORATORY DATA: Lab Results  Component Value Date   WBC 6.0 11/22/2013   HGB 12.6* 11/22/2013   HCT 38.1* 11/22/2013   MCV 92.0 11/22/2013   PLT 160 11/22/2013      Chemistry      Component Value Date/Time   NA 139 06/05/2013 1325   K 4.1 06/05/2013 1325   CO2 26 06/05/2013 1325   BUN 12.7 06/05/2013 1325   CREATININE 1.1 06/05/2013 1325      Component Value Date/Time   CALCIUM 9.5 06/05/2013 1325   ALKPHOS 57  06/05/2013 1325   AST 20 06/05/2013 1325   ALT 11 06/05/2013 1325   BILITOT 0.69 06/05/2013 1325       RADIOGRAPHIC STUDIES: No results found.  ASSESSMENT AND PLAN: This very pleasant 63 years old African American male with microcytic anemia secondary to iron deficiency of unknown etiology. He continues to have improvement in his hemoglobin, hematocrit and iron study. He is currently on treatment with Integra plus 1 capsule by mouth daily and tolerating it fairly well. I recommended for him to  continue the same treatment treatment with with Integra plus. I would see him back for followup visit in 3 months with repeat CBC and iron study. He was advised to call immediately if he has any concerning symptoms in the interval. The patient voices understanding of current disease status and treatment options and is in agreement with the current care plan.  All questions were answered. The patient knows to call the clinic with any problems, questions or concerns. We can certainly see the patient much sooner if necessary.  Disclaimer: This note was dictated with voice recognition software. Similar sounding words can inadvertently be transcribed and may not be corrected upon review.

## 2013-11-27 NOTE — Telephone Encounter (Signed)
gv and printed appt sched and avs fo ropt for Jan 2016

## 2013-12-04 ENCOUNTER — Other Ambulatory Visit: Payer: Self-pay | Admitting: *Deleted

## 2013-12-04 DIAGNOSIS — D539 Nutritional anemia, unspecified: Secondary | ICD-10-CM

## 2013-12-04 MED ORDER — INTEGRA PLUS PO CAPS: ORAL_CAPSULE | ORAL | Status: DC

## 2014-02-18 ENCOUNTER — Telehealth: Payer: Self-pay | Admitting: Internal Medicine

## 2014-02-18 NOTE — Telephone Encounter (Signed)
due to call day 03/05/13 appt moved to AM. lmonvm for pt re appts for 1/8 and 1/13. schedule mailed.

## 2014-02-19 ENCOUNTER — Telehealth: Payer: Self-pay | Admitting: Internal Medicine

## 2014-02-19 NOTE — Telephone Encounter (Signed)
pt called to r/s md visit...done....ok and aware of new d.t

## 2014-02-28 ENCOUNTER — Telehealth: Payer: Self-pay | Admitting: Internal Medicine

## 2014-02-28 ENCOUNTER — Other Ambulatory Visit: Payer: BC Managed Care – PPO

## 2014-02-28 NOTE — Telephone Encounter (Signed)
pt called to r/s ......done...pt aware of new d.t °

## 2014-03-05 ENCOUNTER — Ambulatory Visit: Payer: BC Managed Care – PPO | Admitting: Internal Medicine

## 2014-03-06 ENCOUNTER — Other Ambulatory Visit: Payer: Self-pay | Admitting: Internal Medicine

## 2014-03-07 ENCOUNTER — Other Ambulatory Visit (HOSPITAL_BASED_OUTPATIENT_CLINIC_OR_DEPARTMENT_OTHER): Payer: Medicare Other

## 2014-03-07 DIAGNOSIS — D539 Nutritional anemia, unspecified: Secondary | ICD-10-CM

## 2014-03-07 DIAGNOSIS — D509 Iron deficiency anemia, unspecified: Secondary | ICD-10-CM

## 2014-03-07 LAB — CBC WITH DIFFERENTIAL/PLATELET
BASO%: 0.4 % (ref 0.0–2.0)
Basophils Absolute: 0 10*3/uL (ref 0.0–0.1)
EOS%: 2.6 % (ref 0.0–7.0)
Eosinophils Absolute: 0.2 10*3/uL (ref 0.0–0.5)
HCT: 37.8 % — ABNORMAL LOW (ref 38.4–49.9)
HGB: 12.5 g/dL — ABNORMAL LOW (ref 13.0–17.1)
LYMPH%: 26.2 % (ref 14.0–49.0)
MCH: 31 pg (ref 27.2–33.4)
MCHC: 33.1 g/dL (ref 32.0–36.0)
MCV: 93.8 fL (ref 79.3–98.0)
MONO#: 0.4 10*3/uL (ref 0.1–0.9)
MONO%: 5.5 % (ref 0.0–14.0)
NEUT#: 4.5 10*3/uL (ref 1.5–6.5)
NEUT%: 65.3 % (ref 39.0–75.0)
Platelets: 173 10*3/uL (ref 140–400)
RBC: 4.03 10*6/uL — AB (ref 4.20–5.82)
RDW: 14.9 % — AB (ref 11.0–14.6)
WBC: 6.9 10*3/uL (ref 4.0–10.3)
lymph#: 1.8 10*3/uL (ref 0.9–3.3)

## 2014-03-07 LAB — IRON AND TIBC CHCC
%SAT: 17 % — ABNORMAL LOW (ref 20–55)
Iron: 56 ug/dL (ref 42–163)
TIBC: 335 ug/dL (ref 202–409)
UIBC: 279 ug/dL (ref 117–376)

## 2014-03-07 LAB — FERRITIN CHCC: FERRITIN: 17 ng/mL — AB (ref 22–316)

## 2014-03-17 ENCOUNTER — Encounter: Payer: Self-pay | Admitting: Internal Medicine

## 2014-03-17 ENCOUNTER — Telehealth: Payer: Self-pay | Admitting: Internal Medicine

## 2014-03-17 ENCOUNTER — Ambulatory Visit (HOSPITAL_BASED_OUTPATIENT_CLINIC_OR_DEPARTMENT_OTHER): Payer: Medicare Other | Admitting: Internal Medicine

## 2014-03-17 VITALS — BP 118/67 | HR 92 | Temp 98.5°F | Resp 18 | Ht 69.0 in | Wt 152.3 lb

## 2014-03-17 DIAGNOSIS — D509 Iron deficiency anemia, unspecified: Secondary | ICD-10-CM | POA: Diagnosis not present

## 2014-03-17 DIAGNOSIS — D539 Nutritional anemia, unspecified: Secondary | ICD-10-CM

## 2014-03-17 NOTE — Patient Instructions (Signed)
Smoking Cessation, Tips for Success  If you are ready to quit smoking, congratulations! You have chosen to help yourself be healthier. Cigarettes bring nicotine, tar, carbon monoxide, and other irritants into your body. Your lungs, heart, and blood vessels will be able to work better without these poisons. There are many different ways to quit smoking. Nicotine gum, nicotine patches, a nicotine inhaler, or nicotine nasal spray can help with physical craving. Hypnosis, support groups, and medicines help break the habit of smoking.  WHAT THINGS CAN I DO TO MAKE QUITTING EASIER?   Here are some tips to help you quit for good:  · Pick a date when you will quit smoking completely. Tell all of your friends and family about your plan to quit on that date.  · Do not try to slowly cut down on the number of cigarettes you are smoking. Pick a quit date and quit smoking completely starting on that day.  · Throw away all cigarettes.    · Clean and remove all ashtrays from your home, work, and car.  · On a card, write down your reasons for quitting. Carry the card with you and read it when you get the urge to smoke.  · Cleanse your body of nicotine. Drink enough water and fluids to keep your urine clear or pale yellow. Do this after quitting to flush the nicotine from your body.  · Learn to predict your moods. Do not let a bad situation be your excuse to have a cigarette. Some situations in your life might tempt you into wanting a cigarette.  · Never have "just one" cigarette. It leads to wanting another and another. Remind yourself of your decision to quit.  · Change habits associated with smoking. If you smoked while driving or when feeling stressed, try other activities to replace smoking. Stand up when drinking your coffee. Brush your teeth after eating. Sit in a different chair when you read the paper. Avoid alcohol while trying to quit, and try to drink fewer caffeinated beverages. Alcohol and caffeine may urge you to  smoke.  · Avoid foods and drinks that can trigger a desire to smoke, such as sugary or spicy foods and alcohol.  · Ask people who smoke not to smoke around you.  · Have something planned to do right after eating or having a cup of coffee. For example, plan to take a walk or exercise.  · Try a relaxation exercise to calm you down and decrease your stress. Remember, you may be tense and nervous for the first 2 weeks after you quit, but this will pass.  · Find new activities to keep your hands busy. Play with a pen, coin, or rubber band. Doodle or draw things on paper.  · Brush your teeth right after eating. This will help cut down on the craving for the taste of tobacco after meals. You can also try mouthwash.    · Use oral substitutes in place of cigarettes. Try using lemon drops, carrots, cinnamon sticks, or chewing gum. Keep them handy so they are available when you have the urge to smoke.  · When you have the urge to smoke, try deep breathing.  · Designate your home as a nonsmoking area.  · If you are a heavy smoker, ask your health care provider about a prescription for nicotine chewing gum. It can ease your withdrawal from nicotine.  · Reward yourself. Set aside the cigarette money you save and buy yourself something nice.  · Look for   support from others. Join a support group or smoking cessation program. Ask someone at home or at work to help you with your plan to quit smoking.  · Always ask yourself, "Do I need this cigarette or is this just a reflex?" Tell yourself, "Today, I choose not to smoke," or "I do not want to smoke." You are reminding yourself of your decision to quit.  · Do not replace cigarette smoking with electronic cigarettes (commonly called e-cigarettes). The safety of e-cigarettes is unknown, and some may contain harmful chemicals.  · If you relapse, do not give up! Plan ahead and think about what you will do the next time you get the urge to smoke.  HOW WILL I FEEL WHEN I QUIT SMOKING?  You  may have symptoms of withdrawal because your body is used to nicotine (the addictive substance in cigarettes). You may crave cigarettes, be irritable, feel very hungry, cough often, get headaches, or have difficulty concentrating. The withdrawal symptoms are only temporary. They are strongest when you first quit but will go away within 10-14 days. When withdrawal symptoms occur, stay in control. Think about your reasons for quitting. Remind yourself that these are signs that your body is healing and getting used to being without cigarettes. Remember that withdrawal symptoms are easier to treat than the major diseases that smoking can cause.   Even after the withdrawal is over, expect periodic urges to smoke. However, these cravings are generally short lived and will go away whether you smoke or not. Do not smoke!  WHAT RESOURCES ARE AVAILABLE TO HELP ME QUIT SMOKING?  Your health care provider can direct you to community resources or hospitals for support, which may include:  · Group support.  · Education.  · Hypnosis.  · Therapy.  Document Released: 11/06/2003 Document Revised: 06/24/2013 Document Reviewed: 07/26/2012  ExitCare® Patient Information ©2015 [employer]. This information is not intended to replace advice given to you by your health care provider. Make sure you discuss any questions you have with your health care provider.

## 2014-03-17 NOTE — Telephone Encounter (Signed)
Gave avs & calendar for March. Sent message to schedule treatment, will call with appointments.

## 2014-03-17 NOTE — Progress Notes (Signed)
[facility] [phone]   [phone]) [phone]  OFFICE PROGRESS NOTE  [name], MD [address] [place] [address]  DIAGNOSIS: Microcytic anemia secondary to iron deficiency  PRIOR THERAPY: None  CURRENT THERAPY: Integra plus 1 capsule by mouth daily  INTERVAL HISTORY: Jeffrey Stevens 64 y.o. male returns to the clinic today for followup visit. He has no complaints today except for mild fatigue. He is tolerating his treatment with Integra plus fairly well with no significant adverse effects. He denied having any dizzy spells. He denied having any chest pain, shortness of breath, cough or hemoptysis. He denied having any significant bleeding issues, bruises or ecchymosis. He has repeat CBC performed and iron study performed recently and he is here for evaluation and discussion of his lab results.  MEDICAL HISTORY: Past Medical History  Diagnosis Date  . Diabetes mellitus without complication   . Hypertension   . Dyslipidemia     ALLERGIES:  has No Known Allergies.  MEDICATIONS:  Current Outpatient Prescriptions  Medication Sig Dispense Refill  . ACCU-CHEK SOFTCLIX LANCETS lancets     . aspirin 81 MG tablet Take 81 mg by mouth daily.    . Bromocriptine Mesylate (CYCLOSET) 0.8 MG TABS Take 4 tablets by mouth every morning.    Marland Kitchen FeFum-FePoly-FA-B Cmp-C-Biot (INTEGRA PLUS) CAPS TAKE 1 CAPSULE BY MOUTH EVERY MORNING. 30 capsule 2  . Insulin Glargine (LANTUS [place]) Inject 30 Units into the skin at bedtime.    Marland Kitchen lisinopril-hydrochlorothiazide (PRINZIDE,ZESTORETIC) 20-12.5 MG per tablet Take 1 tablet by mouth daily.    . Multiple Vitamin (MULTIVITAMIN) tablet Take 1 tablet by mouth daily.    . niacin-simvastatin (SIMCOR) 500-20 MG 24 hr tablet Take 1 tablet by mouth at bedtime.    . ONE TOUCH ULTRA TEST test strip     . Saxagliptin-Metformin (KOMBIGLYZE XR) 2.06-998 MG TB24 Take by mouth.     No current facility-administered medications for  this visit.   REVIEW OF SYSTEMS:  A comprehensive review of systems was negative except for: Constitutional: positive for fatigue   PHYSICAL EXAMINATION: General appearance: alert, cooperative and no distress Head: Normocephalic, without obvious abnormality, atraumatic Neck: no adenopathy, no JVD, supple, symmetrical, trachea midline and thyroid not enlarged, symmetric, no tenderness/mass/nodules Lymph nodes: Cervical, supraclavicular, and axillary nodes normal. Resp: clear to auscultation bilaterally Back: symmetric, no curvature. ROM normal. No CVA tenderness. Cardio: regular rate and rhythm, S1, S2 normal, no murmur, click, rub or gallop GI: soft, non-tender; bowel sounds normal; no masses,  no organomegaly Extremities: extremities normal, atraumatic, no cyanosis or edema  ECOG PERFORMANCE STATUS: 0 - Asymptomatic  Blood pressure 118/67, pulse 92, temperature 98.5 F (36.9 C), temperature source Oral, resp. rate 18, height  (1.753 m), weight 152 lb 4.8 oz (69.083 kg), SpO2 100 %.  LABORATORY DATA: Lab Results  Component Value Date   WBC 6.9 03/07/2014   HGB 12.5* 03/07/2014   HCT 37.8* 03/07/2014   MCV 93.8 03/07/2014   PLT 173 03/07/2014      Chemistry      Component Value Date/Time   NA 139 06/05/2013 1325   K 4.1 06/05/2013 1325   CO2 26 06/05/2013 1325   BUN 12.7 06/05/2013 1325   CREATININE 1.1 06/05/2013 1325      Component Value Date/Time   CALCIUM 9.5 06/05/2013 1325   ALKPHOS 57 06/05/2013 1325   AST 20 06/05/2013 1325   ALT 11 06/05/2013 1325  BILITOT 0.69 06/05/2013 1325     Other lab results: Serum ferritin 17, serum iron 56, total iron binding capacity 335 and iron saturation 17%.  RADIOGRAPHIC STUDIES: No results found.  ASSESSMENT AND PLAN: This very pleasant 64 years old African American male with microcytic anemia secondary to iron deficiency of unknown etiology.  His hemoglobin and hematocrit are stable but the serum ferritin and iron  study showed evidence for iron deficiency. He is currently on treatment with Integra plus 1 capsule by mouth daily and tolerating it fairly well but not enough to correct his iron deficiency. I recommended for him to consider treatment with Feraheme infusion for 2 doses 510 mg each. He agreed to proceed with the treatment and the first dose is expected on 03/21/2014. I would see him back for followup visit in 2 months with repeat CBC and iron study. He was advised to call immediately if he has any concerning symptoms in the interval. The patient voices understanding of current disease status and treatment options and is in agreement with the current care plan.  All questions were answered. The patient knows to call the clinic with any problems, questions or concerns. We can certainly see the patient much sooner if necessary.  Disclaimer: This note was dictated with voice recognition software. Similar sounding words can inadvertently be transcribed and may not be corrected upon review.

## 2014-03-18 ENCOUNTER — Telehealth: Payer: Self-pay | Admitting: *Deleted

## 2014-03-18 ENCOUNTER — Telehealth: Payer: Self-pay | Admitting: Internal Medicine

## 2014-03-18 NOTE — Telephone Encounter (Signed)
Per staff message and POF I have scheduled appts. Advised scheduler of appts and that this is a high risk drug. appt must be no later than 12pm.  JMW

## 2014-03-18 NOTE — Telephone Encounter (Signed)
Left message confirming appointments.

## 2014-03-21 ENCOUNTER — Ambulatory Visit (HOSPITAL_BASED_OUTPATIENT_CLINIC_OR_DEPARTMENT_OTHER): Payer: Medicare Other

## 2014-03-21 DIAGNOSIS — D509 Iron deficiency anemia, unspecified: Secondary | ICD-10-CM | POA: Diagnosis not present

## 2014-03-21 DIAGNOSIS — D539 Nutritional anemia, unspecified: Secondary | ICD-10-CM

## 2014-03-21 MED ORDER — SODIUM CHLORIDE 0.9 % IV SOLN
Freq: Once | INTRAVENOUS | Status: AC
  Administered 2014-03-21: 12:00:00 via INTRAVENOUS

## 2014-03-21 MED ORDER — SODIUM CHLORIDE 0.9 % IV SOLN
510.0000 mg | Freq: Once | INTRAVENOUS | Status: AC
  Administered 2014-03-21: 510 mg via INTRAVENOUS
  Filled 2014-03-21: qty 17

## 2014-03-21 NOTE — Patient Instructions (Signed)

## 2014-03-21 NOTE — Progress Notes (Signed)
Pt observed 30 mins post feraheme. No reaction. No concerns at discharged. Discharged with wife, ambulatory, no acute distress.

## 2014-03-28 ENCOUNTER — Ambulatory Visit (HOSPITAL_BASED_OUTPATIENT_CLINIC_OR_DEPARTMENT_OTHER): Payer: Medicare Other

## 2014-03-28 DIAGNOSIS — D539 Nutritional anemia, unspecified: Secondary | ICD-10-CM

## 2014-03-28 DIAGNOSIS — D509 Iron deficiency anemia, unspecified: Secondary | ICD-10-CM

## 2014-03-28 MED ORDER — SODIUM CHLORIDE 0.9 % IV SOLN
Freq: Once | INTRAVENOUS | Status: AC
  Administered 2014-03-28: 15:00:00 via INTRAVENOUS

## 2014-03-28 MED ORDER — SODIUM CHLORIDE 0.9 % IV SOLN
510.0000 mg | Freq: Once | INTRAVENOUS | Status: AC
  Administered 2014-03-28: 510 mg via INTRAVENOUS
  Filled 2014-03-28: qty 17

## 2014-03-28 NOTE — Patient Instructions (Signed)

## 2014-04-25 DIAGNOSIS — E119 Type 2 diabetes mellitus without complications: Secondary | ICD-10-CM | POA: Diagnosis not present

## 2014-04-25 DIAGNOSIS — G5761 Lesion of plantar nerve, right lower limb: Secondary | ICD-10-CM | POA: Diagnosis not present

## 2014-04-25 DIAGNOSIS — M2011 Hallux valgus (acquired), right foot: Secondary | ICD-10-CM | POA: Diagnosis not present

## 2014-04-25 DIAGNOSIS — M2012 Hallux valgus (acquired), left foot: Secondary | ICD-10-CM | POA: Diagnosis not present

## 2014-05-08 DIAGNOSIS — G5761 Lesion of plantar nerve, right lower limb: Secondary | ICD-10-CM | POA: Diagnosis not present

## 2014-05-12 ENCOUNTER — Other Ambulatory Visit: Payer: BLUE CROSS/BLUE SHIELD

## 2014-05-12 DIAGNOSIS — I10 Essential (primary) hypertension: Secondary | ICD-10-CM | POA: Diagnosis not present

## 2014-05-12 DIAGNOSIS — E0800 Diabetes mellitus due to underlying condition with hyperosmolarity without nonketotic hyperglycemic-hyperosmolar coma (NKHHC): Secondary | ICD-10-CM | POA: Diagnosis not present

## 2014-05-16 ENCOUNTER — Other Ambulatory Visit (HOSPITAL_BASED_OUTPATIENT_CLINIC_OR_DEPARTMENT_OTHER): Payer: BLUE CROSS/BLUE SHIELD

## 2014-05-16 DIAGNOSIS — D509 Iron deficiency anemia, unspecified: Secondary | ICD-10-CM

## 2014-05-16 DIAGNOSIS — D539 Nutritional anemia, unspecified: Secondary | ICD-10-CM

## 2014-05-16 DIAGNOSIS — Z23 Encounter for immunization: Secondary | ICD-10-CM | POA: Diagnosis not present

## 2014-05-16 LAB — CBC WITH DIFFERENTIAL/PLATELET
BASO%: 1.1 % (ref 0.0–2.0)
BASOS ABS: 0.1 10*3/uL (ref 0.0–0.1)
EOS ABS: 0.2 10*3/uL (ref 0.0–0.5)
EOS%: 3.5 % (ref 0.0–7.0)
HCT: 41 % (ref 38.4–49.9)
HEMOGLOBIN: 13.5 g/dL (ref 13.0–17.1)
LYMPH%: 28.9 % (ref 14.0–49.0)
MCH: 31.9 pg (ref 27.2–33.4)
MCHC: 33 g/dL (ref 32.0–36.0)
MCV: 96.6 fL (ref 79.3–98.0)
MONO#: 0.4 10*3/uL (ref 0.1–0.9)
MONO%: 7.9 % (ref 0.0–14.0)
NEUT%: 58.6 % (ref 39.0–75.0)
NEUTROS ABS: 2.8 10*3/uL (ref 1.5–6.5)
Platelets: 183 10*3/uL (ref 140–400)
RBC: 4.25 10*6/uL (ref 4.20–5.82)
RDW: 15.9 % — AB (ref 11.0–14.6)
WBC: 4.8 10*3/uL (ref 4.0–10.3)
lymph#: 1.4 10*3/uL (ref 0.9–3.3)

## 2014-05-16 LAB — IRON AND TIBC CHCC
%SAT: 38 % (ref 20–55)
Iron: 85 ug/dL (ref 42–163)
TIBC: 225 ug/dL (ref 202–409)
UIBC: 140 ug/dL (ref 117–376)

## 2014-05-16 LAB — FERRITIN CHCC: Ferritin: 210 ng/ml (ref 22–316)

## 2014-05-19 ENCOUNTER — Telehealth: Payer: Self-pay | Admitting: Internal Medicine

## 2014-05-19 ENCOUNTER — Encounter: Payer: Self-pay | Admitting: Internal Medicine

## 2014-05-19 ENCOUNTER — Ambulatory Visit (HOSPITAL_BASED_OUTPATIENT_CLINIC_OR_DEPARTMENT_OTHER): Payer: BLUE CROSS/BLUE SHIELD | Admitting: Internal Medicine

## 2014-05-19 VITALS — BP 107/63 | HR 80 | Temp 98.6°F | Resp 18 | Ht 69.0 in | Wt 153.1 lb

## 2014-05-19 DIAGNOSIS — D509 Iron deficiency anemia, unspecified: Secondary | ICD-10-CM

## 2014-05-19 DIAGNOSIS — D539 Nutritional anemia, unspecified: Secondary | ICD-10-CM

## 2014-05-19 NOTE — Patient Instructions (Signed)
Smoking Cessation Quitting smoking is important to your health and has many advantages. However, it is not always easy to quit since nicotine is a very addictive drug. Oftentimes, people try 3 times or more before being able to quit. This document explains the best ways for you to prepare to quit smoking. Quitting takes hard work and a lot of effort, but you can do it. ADVANTAGES OF QUITTING SMOKING  You will live longer, feel better, and live better.  Your body will feel the impact of quitting smoking almost immediately.  Within 20 minutes, blood pressure decreases. Your pulse returns to its normal level.  After 8 hours, carbon monoxide levels in the blood return to normal. Your oxygen level increases.  After 24 hours, the chance of having a heart attack starts to decrease. Your breath, hair, and body stop smelling like smoke.  After 48 hours, damaged nerve endings begin to recover. Your sense of taste and smell improve.  After 72 hours, the body is virtually free of nicotine. Your bronchial tubes relax and breathing becomes easier.  After 2 to 12 weeks, lungs can hold more air. Exercise becomes easier and circulation improves.  The risk of having a heart attack, stroke, cancer, or lung disease is greatly reduced.  After 1 year, the risk of coronary heart disease is cut in half.  After 5 years, the risk of stroke falls to the same as a nonsmoker.  After 10 years, the risk of lung cancer is cut in half and the risk of other cancers decreases significantly.  After 15 years, the risk of coronary heart disease drops, usually to the level of a nonsmoker.  If you are pregnant, quitting smoking will improve your chances of having a healthy baby.  The people you live with, especially any children, will be healthier.  You will have extra money to spend on things other than cigarettes. QUESTIONS TO THINK ABOUT BEFORE ATTEMPTING TO QUIT You may want to talk about your answers with your  health care provider.  Why do you want to quit?  If you tried to quit in the past, what helped and what did not?  What will be the most difficult situations for you after you quit? How will you plan to handle them?  Who can help you through the tough times? Your family? Friends? A health care provider?  What pleasures do you get from smoking? What ways can you still get pleasure if you quit? Here are some questions to ask your health care provider:  How can you help me to be successful at quitting?  What medicine do you think would be best for me and how should I take it?  What should I do if I need more help?  What is smoking withdrawal like? How can I get information on withdrawal? GET READY  Set a quit date.  Change your environment by getting rid of all cigarettes, ashtrays, matches, and lighters in your home, car, or work. Do not let people smoke in your home.  Review your past attempts to quit. Think about what worked and what did not. GET SUPPORT AND ENCOURAGEMENT You have a better chance of being successful if you have help. You can get support in many ways.  Tell your family, friends, and coworkers that you are going to quit and need their support. Ask them not to smoke around you.  Get individual, group, or telephone counseling and support. Programs are available at local hospitals and health centers. Call   your local health department for information about programs in your area.  Spiritual beliefs and practices may help some smokers quit.  Download a "quit meter" on your computer to keep track of quit statistics, such as how long you have gone without smoking, cigarettes not smoked, and money saved.  Get a self-help book about quitting smoking and staying off tobacco. LEARN NEW SKILLS AND BEHAVIORS  Distract yourself from urges to smoke. Talk to someone, go for a walk, or occupy your time with a task.  Change your normal routine. Take a different route to work.  Drink tea instead of coffee. Eat breakfast in a different place.  Reduce your stress. Take a hot bath, exercise, or read a book.  Plan something enjoyable to do every day. Reward yourself for not smoking.  Explore interactive web-based programs that specialize in helping you quit. GET MEDICINE AND USE IT CORRECTLY Medicines can help you stop smoking and decrease the urge to smoke. Combining medicine with the above behavioral methods and support can greatly increase your chances of successfully quitting smoking.  Nicotine replacement therapy helps deliver nicotine to your body without the negative effects and risks of smoking. Nicotine replacement therapy includes nicotine gum, lozenges, inhalers, nasal sprays, and skin patches. Some may be available over-the-counter and others require a prescription.  Antidepressant medicine helps people abstain from smoking, but how this works is unknown. This medicine is available by prescription.  Nicotinic receptor partial agonist medicine simulates the effect of nicotine in your brain. This medicine is available by prescription. Ask your health care provider for advice about which medicines to use and how to use them based on your health history. Your health care provider will tell you what side effects to look out for if you choose to be on a medicine or therapy. Carefully read the information on the package. Do not use any other product containing nicotine while using a nicotine replacement product.  RELAPSE OR DIFFICULT SITUATIONS Most relapses occur within the first 3 months after quitting. Do not be discouraged if you start smoking again. Remember, most people try several times before finally quitting. You may have symptoms of withdrawal because your body is used to nicotine. You may crave cigarettes, be irritable, feel very hungry, cough often, get headaches, or have difficulty concentrating. The withdrawal symptoms are only temporary. They are strongest  when you first quit, but they will go away within 10-14 days. To reduce the chances of relapse, try to:  Avoid drinking alcohol. Drinking lowers your chances of successfully quitting.  Reduce the amount of caffeine you consume. Once you quit smoking, the amount of caffeine in your body increases and can give you symptoms, such as a rapid heartbeat, sweating, and anxiety.  Avoid smokers because they can make you want to smoke.  Do not let weight gain distract you. Many smokers will gain weight when they quit, usually less than 10 pounds. Eat a healthy diet and stay active. You can always lose the weight gained after you quit.  Find ways to improve your mood other than smoking. FOR MORE INFORMATION  www.smokefree.gov  Document Released: 02/01/2001 Document Revised: 06/24/2013 Document Reviewed: 05/19/2011 ExitCare Patient Information 2015 [employer]. This information is not intended to replace advice given to you by your health care provider. Make sure you discuss any questions you have with your health care provider.  

## 2014-05-19 NOTE — Telephone Encounter (Signed)
Gave avs & calendar for September °

## 2014-05-19 NOTE — Progress Notes (Signed)
[facility] [phone]   Fax:(336) [phone]  OFFICE PROGRESS NOTE  [name], MD [address] [place] [address]  DIAGNOSIS: Microcytic anemia secondary to iron deficiency  PRIOR THERAPY: Status post Feraheme infusion 510 MG IV 2 doses last dose was given on 03/28/2014  CURRENT THERAPY: Integra plus 1 capsule by mouth daily.  INTERVAL HISTORY: Jeffrey Stevens 64 y.o. male returns to the clinic today for followup visit accompanied by his wife. He has no complaints today. He denied having any significant fatigue or weakness. He received 2 doses of Feraheme infusion last dose was given 03/24/2014. He denied having any dizzy spells. He denied having any chest pain, shortness of breath, cough or hemoptysis. He denied having any significant bleeding issues, bruises or ecchymosis. He has repeat CBC performed and iron study and ferritin performed recently and he is here for evaluation and discussion of his lab results.  MEDICAL HISTORY: Past Medical History  Diagnosis Date  . Diabetes mellitus without complication   . Hypertension   . Dyslipidemia     ALLERGIES:  has No Known Allergies.  MEDICATIONS:  Current Outpatient Prescriptions  Medication Sig Dispense Refill  . ACCU-CHEK SOFTCLIX LANCETS lancets     . Bromocriptine Mesylate (CYCLOSET) 0.8 MG TABS Take 4 tablets by mouth every morning.    Marland Kitchen FeFum-FePoly-FA-B Cmp-C-Biot (INTEGRA PLUS) CAPS TAKE 1 CAPSULE BY MOUTH EVERY MORNING. 30 capsule 2  . Insulin Glargine (LANTUS [place]) Inject 30 Units into the skin at bedtime.    Marland Kitchen lisinopril-hydrochlorothiazide (PRINZIDE,ZESTORETIC) 20-12.5 MG per tablet Take 1 tablet by mouth daily.    . Multiple Vitamin (MULTIVITAMIN) tablet Take 1 tablet by mouth daily.    . niacin (NIASPAN) 500 MG CR tablet Take 500 mg by mouth daily.  3  . ONE TOUCH ULTRA TEST test strip     . Saxagliptin-Metformin (KOMBIGLYZE XR) 2.06-998 MG TB24 Take by mouth.    .  simvastatin (ZOCOR) 20 MG tablet Take 20 mg by mouth daily.  3  . aspirin 81 MG tablet Take 81 mg by mouth daily.    Marland Kitchen LANTUS 100 UNIT/ML injection   4   No current facility-administered medications for this visit.   REVIEW OF SYSTEMS:  A comprehensive review of systems was negative.   PHYSICAL EXAMINATION: General appearance: alert, cooperative and no distress Head: Normocephalic, without obvious abnormality, atraumatic Neck: no adenopathy, no JVD, supple, symmetrical, trachea midline and thyroid not enlarged, symmetric, no tenderness/mass/nodules Lymph nodes: Cervical, supraclavicular, and axillary nodes normal. Resp: clear to auscultation bilaterally Back: symmetric, no curvature. ROM normal. No CVA tenderness. Cardio: regular rate and rhythm, S1, S2 normal, no murmur, click, rub or gallop GI: soft, non-tender; bowel sounds normal; no masses,  no organomegaly Extremities: extremities normal, atraumatic, no cyanosis or edema  ECOG PERFORMANCE STATUS: 0 - Asymptomatic  Blood pressure 107/63, pulse 80, temperature 98.6 F (37 C), temperature source Oral, resp. rate 18, height  (1.753 m), weight 153 lb 1.6 oz (69.446 kg), SpO2 99 %.  LABORATORY DATA: Lab Results  Component Value Date   WBC 4.8 05/16/2014   HGB 13.5 05/16/2014   HCT 41.0 05/16/2014   MCV 96.6 05/16/2014   PLT 183 05/16/2014      Chemistry      Component Value Date/Time   NA 139 06/05/2013 1325   K 4.1 06/05/2013 1325   CO2 26 06/05/2013 1325   BUN 12.7 06/05/2013 1325  CREATININE 1.1 06/05/2013 1325      Component Value Date/Time   CALCIUM 9.5 06/05/2013 1325   ALKPHOS 57 06/05/2013 1325   AST 20 06/05/2013 1325   ALT 11 06/05/2013 1325   BILITOT 0.69 06/05/2013 1325     Other lab results: Serum ferritin 210, serum iron 85, total iron binding capacity 225 and iron saturation 38%.  RADIOGRAPHIC STUDIES: No results found.  ASSESSMENT AND PLAN: This very pleasant 64 years old African American  male with microcytic anemia secondary to iron deficiency of unclear etiology.  Status post Feraheme infusion and he has significant improvement in his hemoglobin and hematocrit as well as the iron study and ferritin.  I discussed the lab result with the patient and his wife and recommended for him to continue on the oral iron tablets for now I would see him back for followup visit in 6 months with repeat CBC, iron study and ferritin. He was advised to call immediately if he has any concerning symptoms in the interval. The patient voices understanding of current disease status and treatment options and is in agreement with the current care plan.  All questions were answered. The patient knows to call the clinic with any problems, questions or concerns. We can certainly see the patient much sooner if necessary.  Disclaimer: This note was dictated with voice recognition software. Similar sounding words can inadvertently be transcribed and may not be corrected upon review.

## 2014-05-23 ENCOUNTER — Encounter (HOSPITAL_COMMUNITY): Payer: Self-pay | Admitting: General Practice

## 2014-05-23 ENCOUNTER — Inpatient Hospital Stay (HOSPITAL_COMMUNITY)
Admission: AD | Admit: 2014-05-23 | Discharge: 2014-05-25 | DRG: 871 | Disposition: A | Discharge status: Home / Self Care | Payer: BLUE CROSS/BLUE SHIELD | Source: Ambulatory Visit | Attending: Internal Medicine | Admitting: Internal Medicine

## 2014-05-23 ENCOUNTER — Ambulatory Visit (INDEPENDENT_AMBULATORY_CARE_PROVIDER_SITE_OTHER): Payer: BLUE CROSS/BLUE SHIELD | Admitting: Family Medicine

## 2014-05-23 ENCOUNTER — Ambulatory Visit (INDEPENDENT_AMBULATORY_CARE_PROVIDER_SITE_OTHER): Payer: BLUE CROSS/BLUE SHIELD

## 2014-05-23 VITALS — BP 118/60 | HR 103 | Temp 100.9°F | Resp 24 | Ht 68.0 in | Wt 148.0 lb

## 2014-05-23 DIAGNOSIS — A419 Sepsis, unspecified organism: Secondary | ICD-10-CM | POA: Diagnosis not present

## 2014-05-23 DIAGNOSIS — E119 Type 2 diabetes mellitus without complications: Secondary | ICD-10-CM | POA: Diagnosis not present

## 2014-05-23 DIAGNOSIS — I10 Essential (primary) hypertension: Secondary | ICD-10-CM | POA: Insufficient documentation

## 2014-05-23 DIAGNOSIS — E039 Hypothyroidism, unspecified: Secondary | ICD-10-CM | POA: Diagnosis present

## 2014-05-23 DIAGNOSIS — R000 Tachycardia, unspecified: Secondary | ICD-10-CM

## 2014-05-23 DIAGNOSIS — IMO0002 Reserved for concepts with insufficient information to code with codable children: Secondary | ICD-10-CM | POA: Diagnosis present

## 2014-05-23 DIAGNOSIS — R05 Cough: Secondary | ICD-10-CM | POA: Diagnosis not present

## 2014-05-23 DIAGNOSIS — R059 Cough, unspecified: Secondary | ICD-10-CM

## 2014-05-23 DIAGNOSIS — J1000 Influenza due to other identified influenza virus with unspecified type of pneumonia: Secondary | ICD-10-CM | POA: Diagnosis present

## 2014-05-23 DIAGNOSIS — J441 Chronic obstructive pulmonary disease with (acute) exacerbation: Secondary | ICD-10-CM | POA: Diagnosis not present

## 2014-05-23 DIAGNOSIS — E785 Hyperlipidemia, unspecified: Secondary | ICD-10-CM | POA: Diagnosis present

## 2014-05-23 DIAGNOSIS — J189 Pneumonia, unspecified organism: Secondary | ICD-10-CM | POA: Diagnosis not present

## 2014-05-23 DIAGNOSIS — Z7982 Long term (current) use of aspirin: Secondary | ICD-10-CM

## 2014-05-23 DIAGNOSIS — Z720 Tobacco use: Secondary | ICD-10-CM | POA: Diagnosis present

## 2014-05-23 DIAGNOSIS — R509 Fever, unspecified: Secondary | ICD-10-CM | POA: Diagnosis not present

## 2014-05-23 DIAGNOSIS — Z794 Long term (current) use of insulin: Secondary | ICD-10-CM | POA: Diagnosis not present

## 2014-05-23 DIAGNOSIS — E1165 Type 2 diabetes mellitus with hyperglycemia: Secondary | ICD-10-CM | POA: Diagnosis present

## 2014-05-23 DIAGNOSIS — J09X1 Influenza due to identified novel influenza A virus with pneumonia: Secondary | ICD-10-CM | POA: Diagnosis present

## 2014-05-23 DIAGNOSIS — R0902 Hypoxemia: Secondary | ICD-10-CM | POA: Diagnosis not present

## 2014-05-23 DIAGNOSIS — R0602 Shortness of breath: Secondary | ICD-10-CM | POA: Diagnosis not present

## 2014-05-23 DIAGNOSIS — D539 Nutritional anemia, unspecified: Secondary | ICD-10-CM

## 2014-05-23 HISTORY — DX: Pneumonia, unspecified organism: J18.9

## 2014-05-23 LAB — CBC WITH DIFFERENTIAL/PLATELET
Basophils Absolute: 0 10*3/uL (ref 0.0–0.1)
Basophils Relative: 0 % (ref 0–1)
EOS ABS: 0 10*3/uL (ref 0.0–0.7)
Eosinophils Relative: 0 % (ref 0–5)
HCT: 37.3 % — ABNORMAL LOW (ref 39.0–52.0)
Hemoglobin: 12.4 g/dL — ABNORMAL LOW (ref 13.0–17.0)
Lymphocytes Relative: 13 % (ref 12–46)
Lymphs Abs: 0.7 10*3/uL (ref 0.7–4.0)
MCH: 31.9 pg (ref 26.0–34.0)
MCHC: 33.2 g/dL (ref 30.0–36.0)
MCV: 95.9 fL (ref 78.0–100.0)
MONO ABS: 0.4 10*3/uL (ref 0.1–1.0)
Monocytes Relative: 8 % (ref 3–12)
NEUTROS PCT: 79 % — AB (ref 43–77)
Neutro Abs: 4.1 10*3/uL (ref 1.7–7.7)
PLATELETS: 137 10*3/uL — AB (ref 150–400)
RBC: 3.89 MIL/uL — ABNORMAL LOW (ref 4.22–5.81)
RDW: 16 % — AB (ref 11.5–15.5)
WBC: 5.2 10*3/uL (ref 4.0–10.5)

## 2014-05-23 LAB — COMPREHENSIVE METABOLIC PANEL
ALBUMIN: 3.6 g/dL (ref 3.5–5.2)
ALK PHOS: 42 U/L (ref 39–117)
ALT: 23 U/L (ref 0–53)
AST: 50 U/L — ABNORMAL HIGH (ref 0–37)
Anion gap: 12 (ref 5–15)
BUN: 19 mg/dL (ref 6–23)
CALCIUM: 8.9 mg/dL (ref 8.4–10.5)
CO2: 24 mmol/L (ref 19–32)
Chloride: 101 mmol/L (ref 96–112)
Creatinine, Ser: 1.53 mg/dL — ABNORMAL HIGH (ref 0.50–1.35)
GFR, EST AFRICAN AMERICAN: 53 mL/min — AB (ref >90)
GFR, EST NON AFRICAN AMERICAN: 46 mL/min — AB (ref >90)
GLUCOSE: 80 mg/dL (ref 70–99)
Potassium: 3.3 mmol/L — ABNORMAL LOW (ref 3.5–5.1)
Sodium: 137 mmol/L (ref 135–145)
TOTAL PROTEIN: 6.5 g/dL (ref 6.0–8.3)
Total Bilirubin: 0.9 mg/dL (ref 0.3–1.2)

## 2014-05-23 LAB — RAPID HIV SCREEN (HIV 1/2 AB+AG)
HIV 1/2 ANTIBODIES: NONREACTIVE
HIV-1 P24 ANTIGEN - HIV24: NONREACTIVE

## 2014-05-23 LAB — POCT CBC
Granulocyte percent: 78.8 %G (ref 37–80)
HCT, POC: 40.9 % — AB (ref 43.5–53.7)
Hemoglobin: 13.2 g/dL — AB (ref 14.1–18.1)
LYMPH, POC: 0.9 (ref 0.6–3.4)
MCH: 31.3 pg — AB (ref 27–31.2)
MCHC: 32.3 g/dL (ref 31.8–35.4)
MCV: 97 fL (ref 80–97)
MID (cbc): 0.5 (ref 0–0.9)
MPV: 6.3 fL (ref 0–99.8)
POC GRANULOCYTE: 5.3 (ref 2–6.9)
POC LYMPH PERCENT: 13.5 %L (ref 10–50)
POC MID %: 7.7 % (ref 0–12)
Platelet Count, POC: 186 10*3/uL (ref 142–424)
RBC: 4.22 M/uL — AB (ref 4.69–6.13)
RDW, POC: 17.6 %
WBC: 6.7 10*3/uL (ref 4.6–10.2)

## 2014-05-23 LAB — LIPID PANEL
Cholesterol: 106 mg/dL (ref 0–200)
HDL: 41 mg/dL (ref >39)
LDL Cholesterol: 58 mg/dL (ref 0–99)
TRIGLYCERIDES: 35 mg/dL (ref <150)
Total CHOL/HDL Ratio: 2.6 RATIO
VLDL: 7 mg/dL (ref 0–40)

## 2014-05-23 LAB — POCT INFLUENZA A/B
INFLUENZA A, POC: POSITIVE
INFLUENZA B, POC: NEGATIVE

## 2014-05-23 LAB — GLUCOSE, POCT (MANUAL RESULT ENTRY): POC Glucose: 90 mg/dl (ref 70–99)

## 2014-05-23 LAB — MAGNESIUM: Magnesium: 1.9 mg/dL (ref 1.5–2.5)

## 2014-05-23 LAB — GLUCOSE, CAPILLARY: Glucose-Capillary: 79 mg/dL (ref 70–99)

## 2014-05-23 LAB — LACTIC ACID, PLASMA: LACTIC ACID, VENOUS: 0.7 mmol/L (ref 0.5–2.0)

## 2014-05-23 MED ORDER — SODIUM CHLORIDE 0.9 % IV SOLN
INTRAVENOUS | Status: DC
  Administered 2014-05-23 – 2014-05-25 (×3): via INTRAVENOUS

## 2014-05-23 MED ORDER — SIMVASTATIN 20 MG PO TABS
20.0000 mg | ORAL_TABLET | Freq: Every day | ORAL | Status: DC
  Administered 2014-05-23 – 2014-05-24 (×2): 20 mg via ORAL
  Filled 2014-05-23 (×3): qty 1

## 2014-05-23 MED ORDER — IPRATROPIUM-ALBUTEROL 0.5-2.5 (3) MG/3ML IN SOLN
3.0000 mL | Freq: Four times a day (QID) | RESPIRATORY_TRACT | Status: DC
  Administered 2014-05-23: 3 mL via RESPIRATORY_TRACT
  Filled 2014-05-23: qty 3

## 2014-05-23 MED ORDER — IBUPROFEN 200 MG PO TABS: 600.0000 mg | ORAL_TABLET | Freq: Once | ORAL | Status: DC

## 2014-05-23 MED ORDER — METHYLPREDNISOLONE SODIUM SUCC 125 MG IJ SOLR
60.0000 mg | INTRAMUSCULAR | Status: DC
  Administered 2014-05-23: 60 mg via INTRAVENOUS
  Filled 2014-05-23: qty 0.96
  Filled 2014-05-23: qty 2

## 2014-05-23 MED ORDER — DM-GUAIFENESIN ER 30-600 MG PO TB12
1.0000 | ORAL_TABLET | Freq: Two times a day (BID) | ORAL | Status: DC
  Administered 2014-05-23 – 2014-05-25 (×4): 1 via ORAL
  Filled 2014-05-23 (×5): qty 1

## 2014-05-23 MED ORDER — CEFTRIAXONE SODIUM 1 G IJ SOLR
1.0000 g | Freq: Once | INTRAMUSCULAR | Status: AC
  Administered 2014-05-23: 1 g via INTRAMUSCULAR

## 2014-05-23 MED ORDER — NICOTINE 21 MG/24HR TD PT24
21.0000 mg | MEDICATED_PATCH | Freq: Every day | TRANSDERMAL | Status: DC
  Administered 2014-05-23 – 2014-05-25 (×3): 21 mg via TRANSDERMAL
  Filled 2014-05-23 (×3): qty 1

## 2014-05-23 MED ORDER — INSULIN ASPART 100 UNIT/ML ~~LOC~~ SOLN
0.0000 [IU] | Freq: Three times a day (TID) | SUBCUTANEOUS | Status: DC
  Administered 2014-05-24: 7 [IU] via SUBCUTANEOUS
  Administered 2014-05-24: 3 [IU] via SUBCUTANEOUS
  Administered 2014-05-24: 9 [IU] via SUBCUTANEOUS
  Administered 2014-05-25: 2 [IU] via SUBCUTANEOUS
  Administered 2014-05-25: 7 [IU] via SUBCUTANEOUS

## 2014-05-23 MED ORDER — INSULIN ASPART 100 UNIT/ML ~~LOC~~ SOLN: 0.0000 [IU] | Freq: Every day | SUBCUTANEOUS | Status: DC

## 2014-05-23 MED ORDER — ADULT MULTIVITAMIN W/MINERALS CH
1.0000 | ORAL_TABLET | Freq: Every day | ORAL | Status: DC
  Administered 2014-05-25: 1 via ORAL
  Filled 2014-05-23 (×3): qty 1

## 2014-05-23 MED ORDER — HEPARIN SODIUM (PORCINE) 5000 UNIT/ML IJ SOLN
5000.0000 [IU] | Freq: Three times a day (TID) | INTRAMUSCULAR | Status: DC
  Administered 2014-05-23 – 2014-05-25 (×5): 5000 [IU] via SUBCUTANEOUS
  Filled 2014-05-23 (×7): qty 1

## 2014-05-23 MED ORDER — NIACIN ER (ANTIHYPERLIPIDEMIC) 500 MG PO TBCR
500.0000 mg | EXTENDED_RELEASE_TABLET | Freq: Every day | ORAL | Status: DC
  Administered 2014-05-24 – 2014-05-25 (×2): 500 mg via ORAL
  Filled 2014-05-23 (×2): qty 1

## 2014-05-23 MED ORDER — OSELTAMIVIR PHOSPHATE 75 MG PO CAPS
75.0000 mg | ORAL_CAPSULE | Freq: Two times a day (BID) | ORAL | Status: DC
  Administered 2014-05-23 – 2014-05-25 (×4): 75 mg via ORAL
  Filled 2014-05-23 (×5): qty 1

## 2014-05-23 MED ORDER — ACETAMINOPHEN 325 MG PO TABS: 1000.0000 mg | ORAL_TABLET | Freq: Once | ORAL | Status: AC

## 2014-05-23 MED ORDER — ASPIRIN EC 81 MG PO TBEC
81.0000 mg | DELAYED_RELEASE_TABLET | Freq: Every day | ORAL | Status: DC
  Administered 2014-05-24 – 2014-05-25 (×2): 81 mg via ORAL
  Filled 2014-05-23 (×2): qty 1

## 2014-05-23 NOTE — Progress Notes (Signed)
Patient admitted through the Urgent Care with Fever and generalized malaise. On arrival he was alert and oriented, accompanied by his daughter. Made comfortable in bed and oriented to the room and call bell placed within reach.

## 2014-05-23 NOTE — H&P (Signed)
[facility] History and Physical  Jeffrey Lombardurcell A Balboa ZOX:[id number] DOB: 02/18/1951 DOA: 05/23/2014  Referring physician:  PCP: Geraldo PitterBLAND,VEITA J, MD  Specialists:   Chief Complaint: SOB, fatigue,    HPI: Jeffrey Stevens is a 64 y.o. BM PMHx  He is a pt of Dr. Parke SimmersBland, PMHx HTN, HLD, diabetes type 2 uncontrolled (hemoglobin A1c last week 8.1), tobacco abuse. [facility] [address] [place] New patient with illness. 3 days ago he noted onset of illness with cough, thenfever. He has gotten worse with continued fever and more severe cough (productive unknown color), weakness, fatigue, and chills, positive sore throat. Poor appetite, some post- tussive emesis but OW no GI symptoms He did have a flu shot this year.Pneumonia and shingles shot obtained last week. No previous history of cardiac problems per patient.     Review of Systems: The patient denies  weight loss,, vision loss, decreased hearing, hoarseness,  syncope,  peripheral edema, balance deficits, hemoptysis, abdominal pain, melena, hematochezia, severe indigestion/heartburn, hematuria, incontinence, genital sores, muscle weakness, suspicious skin lesions, transient blindness, difficulty walking, depression, unusual weight change, abnormal bleeding, enlarged lymph nodes, angioedema, and breast masses.    TRAVEL HISTORY: NA   Consultants:  NA  Procedure/Significant Events:  4/1 CXR;Right middle lobe infiltrate    Culture  4/1 positive influenza A, negative influenza B 4/1 respiratory virus panel pending 4/1 sputum culture pending 4/1 blood pending   Antibiotics:  Ceftriaxone 4/1>> Azithromycin 4/1>> Tamiflu 4/1>>    DVT prophylaxis:  Heparin subcutaneous  Devices     LINES / TUBES:     Past Medical History  Diagnosis Date  . Diabetes mellitus without complication   . Hypertension   . Dyslipidemia   . Pneumonia 05/23/2014   Past Surgical History   Procedure Laterality Date  . Colonoscopy  2015   Social History:  reports that he has been smoking Cigarettes.  He has a 50 pack-year smoking history. He has never used smokeless tobacco. He reports that he does not drink alcohol or use illicit drugs.     No Known Allergies  Family History  Problem Relation Age of Onset  . Diabetes Mother   . Diabetes Father     Prior to Admission medications   Medication Sig Start Date End Date Taking? Authorizing Provider  ACCU-CHEK SOFTCLIX LANCETS lancets  09/16/13   Historical Provider, MD  aspirin 81 MG tablet Take 81 mg by mouth daily.    Historical Provider, MD  Bromocriptine Mesylate (CYCLOSET) 0.8 MG TABS Take 4 tablets by mouth every morning.    Historical Provider, MD  FeFum-FePoly-FA-B Cmp-C-Biot (INTEGRA PLUS) CAPS TAKE 1 CAPSULE BY MOUTH EVERY MORNING. 03/06/14   Si GaulMohamed Mohamed, MD  Insulin Glargine (LANTUS [place]) Inject 30 Units into the skin at bedtime.    Historical Provider, MD  LANTUS 100 UNIT/ML injection  05/18/14   Historical Provider, MD  lisinopril-hydrochlorothiazide (PRINZIDE,ZESTORETIC) 20-12.5 MG per tablet Take 1 tablet by mouth daily.    Historical Provider, MD  Multiple Vitamin (MULTIVITAMIN) tablet Take 1 tablet by mouth daily.    Historical Provider, MD  niacin (NIASPAN) 500 MG CR tablet Take 500 mg by mouth daily. 05/12/14   Historical Provider, MD  ONE TOUCH ULTRA TEST test strip  09/16/13   Historical Provider, MD  Saxagliptin-Metformin (KOMBIGLYZE XR) 2.06-998 MG TB24 Take by mouth.    Historical Provider, MD  simvastatin (ZOCOR) 20 MG tablet Take 20 mg by mouth daily. 05/12/14  Historical Provider, MD   Physical Exam: Filed Vitals:   05/23/14 1745 05/23/14 1810  BP: 108/63 109/54  Pulse: 97 86  Temp: 99.9 F (37.7 C) 100.2 F (37.9 C)  TempSrc: Oral Oral  Resp: 18 16  Height:   (1.753 m)  Weight:  67.9 kg (149 lb 11.1 oz)  SpO2: 94% 93%     General:  A/O 4, acute respiratory distress,  Eyes:  Pupils equal reactive to light and accommodation  ENT: Left anterior tonsillar pillar swelling/erythema/negative discharge  Neck: Negative JVD, negative lymphadenopathy  Cardiovascular: Regular rhythm and rate, negative murmurs rubs or gallops, normal S1/S2  Respiratory: Diffuse mild expiratory wheezing  Abdomen: Soft, nontender, nondistended, plus bowel sound  Skin: NA  Musculoskeletal: NA  Neurologic: Cranial nerves II through XII intact, moves all extremities  Labs on Admission:  Basic Metabolic Panel:  Recent Labs Lab 05/23/14 1855  NA 137  K 3.3*  CL 101  CO2 24  GLUCOSE 80  BUN 19  CREATININE 1.53*  CALCIUM 8.9  MG 1.9   Liver Function Tests:  Recent Labs Lab 05/23/14 1855  AST 50*  ALT 23  ALKPHOS 42  BILITOT 0.9  PROT 6.5  ALBUMIN 3.6   No results for input(s): LIPASE, AMYLASE in the last 168 hours. No results for input(s): AMMONIA in the last 168 hours. CBC:  Recent Labs Lab 05/23/14 1609 05/23/14 1855  WBC 6.7 5.2  NEUTROABS  --  4.1  HGB 13.2* 12.4*  HCT 40.9* 37.3*  MCV 97.0 95.9  PLT  --  137*   Cardiac Enzymes: No results for input(s): CKTOTAL, CKMB, CKMBINDEX, TROPONINI in the last 168 hours.  BNP (last 3 results) No results for input(s): BNP in the last 8760 hours.  ProBNP (last 3 results) No results for input(s): PROBNP in the last 8760 hours.  CBG: No results for input(s): GLUCAP in the last 168 hours.  Radiological Exams on Admission: Dg Chest 2 View  05/23/2014   CLINICAL DATA:  Shortness of breath and weakness  EXAM: CHEST  2 VIEW  COMPARISON:  08/31/2004  FINDINGS: Cardiac shadow is within normal limits. The lungs are well aerated bilaterally. Very mild changes are noted in the right middle lobe consistent with acute infiltrate. No acute bony abnormality is noted.  IMPRESSION: Right middle lobe infiltrate.   Electronically Signed   By: Alcide Clever M.D.   On: 05/23/2014 16:08    EKG: Outside EKG sinus  tachycardia   Assessment/Plan Principal Problem:   Sepsis due to pneumonia Active Problems:   Influenza A with pneumonia   COPD exacerbation   Diabetes type 2, uncontrolled   HLD (hyperlipidemia)   Tobacco abuse   Essential hypertension  Sepsis pneumonia/influenza A positive -Continue antibiotics -Start Tamiflu -Normal saline 143ml/hr -DuoNeb QID -Solu-Medrol 60 mg daily -Mucinex DM -Flutter valve -Respiratory virus panel pending -Blood culture pending  COPD exacerbation -See sepsis pneumonia -Nicotine patch  Diabetes type 2 uncontrolled -Hemoglobin A1c pending -Lipid panel pending -Carb modified diet -Moderate SSI  HTN -Patient's BP soft will hold home BP meds at this time  HLD? -See diabetes type 2 uncontrolled -Continue Zocor 20 mg daily  Tobacco abuse -Nicotine patch    Code Status: Full  Family Communication: Daughter present Disposition Plan: Resolution sepsis  Time spent: 60 minutes Drema Dallas  [facility] Pager [phone]  If 7PM-7AM, please contact night-coverage [email or web address] Password [email or web address] 05/23/2014, 8:06 PM

## 2014-05-23 NOTE — Patient Instructions (Signed)
[facility]. You will be admitted to a hospital room and do not have to go through the ER I hope that you feel much better soon!

## 2014-05-23 NOTE — Progress Notes (Addendum)
[facility] [address], [place] [address] [phone]  Date:  05/23/2014   Name:  Jeffrey Stevens   DOB:  04/30/1950   MRN:  [id number]  PCP:  Geraldo PitterBLAND,VEITA J, MD    Chief Complaint: Cough and Sore Throat   History of Present Illness:  Jeffrey Stevens is a 64 y.o. very pleasant male patient who presents with the following:  Here today as a new patient with illness.  He is a pt of Dr. Parke SimmersBland, and has a history of IDDM.  He also has HTN and dyslipidemia, and is a smoker 3 days ago he noted onset of illness with cough, then fever. He has gotten worse with continued fever and more severe cough, weakness, fatigue, and chills Poor appetite, some post- tussive emesis but OW no GI symptoms He did have a flu shot this year.   States that his DM is generally under good control but I do not have an A1c available  He denies any cardiac history, never had a cath, stent, MI or stress test per his report.    Patient Active Problem List   Diagnosis Date Noted  . Deficiency anemia 06/05/2013    Past Medical History  Diagnosis Date  . Diabetes mellitus without complication   . Hypertension   . Dyslipidemia     No past surgical history on file.  History  Substance Use Topics  . Smoking status: Current Every Day Smoker -- 1.00 packs/day for 50 years  . Smokeless tobacco: Never Used  . Alcohol Use: No    Family History  Problem Relation Age of Onset  . Diabetes Mother   . Diabetes Father     No Known Allergies  Medication list has been reviewed and updated.  Current Outpatient Prescriptions on File Prior to Visit  Medication Sig Dispense Refill  . aspirin 81 MG tablet Take 81 mg by mouth daily.    . Bromocriptine Mesylate (CYCLOSET) 0.8 MG TABS Take 4 tablets by mouth every morning.    Marland Kitchen. FeFum-FePoly-FA-B Cmp-C-Biot (INTEGRA PLUS) CAPS TAKE 1 CAPSULE BY MOUTH EVERY MORNING. 30 capsule 2  . Insulin Glargine (LANTUS [place]) Inject 30 Units into the skin at  bedtime.    Marland Kitchen. lisinopril-hydrochlorothiazide (PRINZIDE,ZESTORETIC) 20-12.5 MG per tablet Take 1 tablet by mouth daily.    . Multiple Vitamin (MULTIVITAMIN) tablet Take 1 tablet by mouth daily.    . niacin (NIASPAN) 500 MG CR tablet Take 500 mg by mouth daily.  3  . Saxagliptin-Metformin (KOMBIGLYZE XR) 2.06-998 MG TB24 Take by mouth.    . simvastatin (ZOCOR) 20 MG tablet Take 20 mg by mouth daily.  3  . ACCU-CHEK SOFTCLIX LANCETS lancets     . LANTUS 100 UNIT/ML injection   4  . ONE TOUCH ULTRA TEST test strip      No current facility-administered medications on file prior to visit.    Review of Systems:  As per HPI- otherwise negative.   Physical Examination: Filed Vitals:   05/23/14 1531  BP: 150/70  Pulse: 115  Temp: 102.1 F (38.9 C)  Resp: 16   Filed Vitals:   05/23/14 1531  Height: 5\' 8"  (1.727 m)  Weight: 148 lb (67.132 kg)   Body mass index is 22.51 kg/(m^2). Ideal Body Weight: Weight in (lb) to have BMI = 25: 164.1  GEN: WDWN, NAD, Non-toxic, A & O x 3, febrile. Appears somewhat ill, tired HEENT: Atraumatic, Normocephalic. Neck supple. No masses, No LAD.  Bilateral TM wnl, oropharynx normal.  PEERL,EOMI.   Ears and Nose: No external deformity. CV: RRR- tahcycardic, No M/G/R. No JVD. No thrill. No extra heart sounds. PULM: CTA B, no wheezes, crackles, rhonchi. No retractions. No resp. distress. No accessory muscle use. ABD: S, NT, ND, +BS. No rebound. No HSM. EXTR: No c/c/e NEURO Normal gait.  PSYCH: Normally interactive. Conversant. Not depressed or anxious appearing.  Calm demeanor.   Retested his O2 sat on a different finger- up to 95- 96%, maintained these number with walking a few steps in clinic.  [facility] reading (PRIMARY) by  Dr. Patsy Lager. CXR: right sided infiltrate  COMPARISON: 08/31/2004  FINDINGS: Cardiac shadow is within normal limits. The lungs are well aerated bilaterally. Very mild changes are noted in the right middle lobe consistent with  acute infiltrate. No acute bony abnormality is noted.  IMPRESSION: Right middle lobe infiltrate.  EKG:  Sinus tachycardia with diffuse mild ST depression in lateral chest leads, 2 and 3.  Discussed with DOD at [facility] cardiology- while this EKG is not specific is it concerning and deserves further observation (serial EKG, enzymes)  Treated with 1,000 mg of tylenol (he had not taken any anti-pyretics prior to his visit today). Given ibuprofen but did not take it  Results for orders placed or performed in visit on 05/23/14  POCT CBC  Result Value Ref Range   WBC 6.7 4.6 - 10.2 K/uL   Lymph, poc 0.9 0.6 - 3.4   POC LYMPH PERCENT 13.5 10 - 50 %L   MID (cbc) 0.5 0 - 0.9   POC MID % 7.7 0 - 12 %M   POC Granulocyte 5.3 2 - 6.9   Granulocyte percent 78.8 37 - 80 %G   RBC 4.22 (A) 4.69 - 6.13 M/uL   Hemoglobin 13.2 (A) 14.1 - 18.1 g/dL   HCT, POC 16.1 (A) 09.6 - 53.7 %   MCV 97.0 80 - 97 fL   MCH, POC 31.3 (A) 27 - 31.2 pg   MCHC 32.3 31.8 - 35.4 g/dL   RDW, POC 04.5 %   Platelet Count, POC 186 142 - 424 K/uL   MPV 6.3 0 - 99.8 fL  POCT glucose (manual entry)  Result Value Ref Range   POC Glucose 90 70 - 99 mg/dl  POCT Influenza A/B  Result Value Ref Range   Influenza A, POC Positive    Influenza B, POC Negative     Given 1 gm or rocephin IM prior to discharge from [facility].   Assessment and Plan: CAP (community acquired pneumonia) - Plan: cefTRIAXone (ROCEPHIN) injection 1 g  Cough - Plan: POCT Influenza A/B, DG Chest 2 View  Hypoxia  Other specified fever - Plan: POCT CBC, acetaminophen (TYLENOL) tablet 975 mg, ibuprofen (ADVIL,MOTRIN) tablet 600 mg  Diabetes mellitus type 2 in nonobese - Plan: POCT glucose (manual entry)  Tachycardia - Plan: EKG 12-Lead  Here today with CAP likely secondary to flu a. Also with abnormal EKG but no CP. Discussed with Dr. Rhona Leavens with [facility] who will admit pt.  His daughter feels comfortble driving him in private vehicle, they decline EMS  transport  Given rocephin IM prior to [facility] I also called and alerted/ gave report to MD on call for [facility] cardiology; Dr. Rhona Leavens had asked me to call and request a consultation for this pt in the am.     Signed Abbe Amsterdam, MD

## 2014-05-24 DIAGNOSIS — J189 Pneumonia, unspecified organism: Secondary | ICD-10-CM | POA: Diagnosis present

## 2014-05-24 LAB — BASIC METABOLIC PANEL
Anion gap: 4 — ABNORMAL LOW (ref 5–15)
BUN: 19 mg/dL (ref 6–23)
CALCIUM: 8.5 mg/dL (ref 8.4–10.5)
CO2: 27 mmol/L (ref 19–32)
Chloride: 105 mmol/L (ref 96–112)
Creatinine, Ser: 1.22 mg/dL (ref 0.50–1.35)
GFR calc Af Amer: 70 mL/min — ABNORMAL LOW (ref >90)
GFR calc non Af Amer: 61 mL/min — ABNORMAL LOW (ref >90)
Glucose, Bld: 218 mg/dL — ABNORMAL HIGH (ref 70–99)
POTASSIUM: 3.9 mmol/L (ref 3.5–5.1)
Sodium: 136 mmol/L (ref 135–145)

## 2014-05-24 LAB — BRAIN NATRIURETIC PEPTIDE: B Natriuretic Peptide: 24.9 pg/mL (ref 0.0–100.0)

## 2014-05-24 LAB — TROPONIN I: Troponin I: <0.03 ng/mL (ref <0.031)

## 2014-05-24 LAB — GLUCOSE, CAPILLARY
GLUCOSE-CAPILLARY: 225 mg/dL — AB (ref 70–99)
GLUCOSE-CAPILLARY: 359 mg/dL — AB (ref 70–99)
Glucose-Capillary: 310 mg/dL — ABNORMAL HIGH (ref 70–99)

## 2014-05-24 MED ORDER — CEFTRIAXONE SODIUM IN DEXTROSE 20 MG/ML IV SOLN
1.0000 g | INTRAVENOUS | Status: DC
  Administered 2014-05-24 – 2014-05-25 (×2): 1 g via INTRAVENOUS
  Filled 2014-05-24 (×2): qty 50

## 2014-05-24 MED ORDER — IPRATROPIUM-ALBUTEROL 0.5-2.5 (3) MG/3ML IN SOLN
3.0000 mL | Freq: Two times a day (BID) | RESPIRATORY_TRACT | Status: DC
Start: 2014-05-24 — End: 2014-05-25
  Administered 2014-05-24 – 2014-05-25 (×2): 3 mL via RESPIRATORY_TRACT
  Filled 2014-05-24 (×2): qty 3

## 2014-05-24 MED ORDER — DEXTROSE 5 % IV SOLN
500.0000 mg | INTRAVENOUS | Status: DC
  Administered 2014-05-24 – 2014-05-25 (×2): 500 mg via INTRAVENOUS
  Filled 2014-05-24 (×2): qty 500

## 2014-05-24 MED ORDER — PREDNISONE 20 MG PO TABS
40.0000 mg | ORAL_TABLET | Freq: Every day | ORAL | Status: DC
Start: 2014-05-24 — End: 2014-05-25
  Administered 2014-05-24 – 2014-05-25 (×2): 40 mg via ORAL
  Filled 2014-05-24 (×3): qty 2

## 2014-05-24 MED ORDER — IPRATROPIUM-ALBUTEROL 0.5-2.5 (3) MG/3ML IN SOLN
3.0000 mL | Freq: Four times a day (QID) | RESPIRATORY_TRACT | Status: DC
  Administered 2014-05-24 (×2): 3 mL via RESPIRATORY_TRACT
  Filled 2014-05-24 (×2): qty 3

## 2014-05-24 MED ORDER — INSULIN GLARGINE 100 UNIT/ML ~~LOC~~ SOLN
45.0000 [IU] | Freq: Every day | SUBCUTANEOUS | Status: DC
  Administered 2014-05-24: 45 [IU] via SUBCUTANEOUS
  Filled 2014-05-24 (×2): qty 0.45

## 2014-05-24 MED ORDER — HYDROCODONE-HOMATROPINE 5-1.5 MG/5ML PO SYRP
10.0000 mL | ORAL_SOLUTION | Freq: Four times a day (QID) | ORAL | Status: DC
  Administered 2014-05-24 – 2014-05-25 (×5): 10 mL via ORAL
  Filled 2014-05-24 (×5): qty 10

## 2014-05-24 NOTE — Progress Notes (Signed)
Pt assessed and found to no longer be in need of Q 6 hour HHN; BS clear, rr 16, NPC, Room air SAT 96%, No home resp meds. Please see additional assessment under Protocol Assessment section.

## 2014-05-24 NOTE — Progress Notes (Addendum)
[facility] PROGRESS NOTE  [name] WUJ:[id number] DOB: [birth year] DOA: 05/23/2014 PCP: [name], MD  Assessment/Plan: Principal Problem:   Sepsis due to pneumonia Active Problems:   Influenza A with pneumonia   COPD exacerbation   Diabetes type 2, uncontrolled   HLD (hyperlipidemia)   Tobacco abuse   Essential hypertension    Sepsis /community-acquired pneumonia/influenza A positive Restarted Rocephin/azithromycin Continue Tamiflu, day #2 -Normal saline 157ml/hr -DuoNeb QID Change Solu-Medrol to by mouth prednisone -Mucinex DM, start the patient on Hycodan for cough -Flutter valve -Respiratory virus panel pending -Blood culture pending HIV antibody negative  Chest pain secondary to pneumonia/pleurisy DC 2-D echo Cardiac enzymes negative  COPD exacerbation -See sepsis pneumonia -Nicotine patch  Diabetes type 2 uncontrolled -Hemoglobin A1c pending -Lipid panel within normal limits-continue Niaspan -Carb modified diet -Moderate SSI  HTN -Patient's BP soft will hold home BP meds at this time  HLD? -See diabetes type 2 uncontrolled -Continue Zocor 20 mg daily  Tobacco abuse -Nicotine patch    Code Status: full Family Communication: family updated about patient's clinical progress Disposition Plan: Anticipate discharge in one to 2 days  Brief narrative: Jeffrey Stevens is a 64 y.o. BM PMHx  He is a pt of Dr. Parke Simmers, PMHx HTN, HLD, diabetes type 2 uncontrolled (hemoglobin A1c last week 8.1), tobacco abuse. [facility] [address] [place]. New patient with illness. 3 days ago he noted onset of illness with cough, thenfever. He has gotten worse with continued fever and more severe cough (productive unknown color), weakness, fatigue, and chills, positive sore throat. Poor appetite, some post- tussive emesis but OW no GI symptoms He did have a flu shot this year.Pneumonia and shingles shot obtained  last week. No previous history of cardiac problems per patient.    Consultants:  None  Procedures:  None  Antibiotics: Tamiflu Rocephin azithromycin  HPI/Subjective: Pleuritic chest pain with coughing, no wheezing  Objective: Filed Vitals:   05/23/14 1810 05/23/14 2132 05/24/14 0525 05/24/14 0732  BP: 109/54 111/56 129/74   Pulse: 86  95   Temp: 100.2 F (37.9 C) 98.4 F (36.9 C) 99.8 F (37.7 C)   TempSrc: Oral Oral Oral   Resp: Height:  (1.753 m)     Weight: 67.9 kg (149 lb 11.1 oz)     SpO2: 93% 96% 91% 95%    Intake/Output Summary (Last 24 hours) at 05/24/14 1013 Last data filed at 05/24/14 0241  Gross per 24 hour  Intake    500 ml  Output    325 ml  Net    175 ml    Exam:  General: No acute respiratory distress Lungs: Clear to auscultation bilaterally without wheezes or crackles Cardiovascular: Regular rate and rhythm without murmur gallop or rub normal S1 and S2 Abdomen: Nontender, nondistended, soft, bowel sounds positive, no rebound, no ascites, no appreciable mass Extremities: No significant cyanosis, clubbing, or edema bilateral lower extremities      Data Reviewed: Basic Metabolic Panel:  Recent Labs Lab 05/23/14 1855 05/24/14 0534  NA 137 136  K 3.3* 3.9  CL 101 105  CO2 24 27  GLUCOSE 80 218*  BUN 19 19  CREATININE 1.53* 1.22  CALCIUM 8.9 8.5  MG 1.9  --     Liver Function Tests:  Recent Labs Lab 05/23/14 1855  AST 50*  ALT 23  ALKPHOS 42  BILITOT 0.9  PROT 6.5  ALBUMIN 3.6  No results for input(s): LIPASE, AMYLASE in the last 168 hours. No results for input(s): AMMONIA in the last 168 hours.  CBC:  Recent Labs Lab 05/23/14 1609 05/23/14 1855  WBC 6.7 5.2  NEUTROABS  --  4.1  HGB 13.2* 12.4*  HCT 40.9* 37.3*  MCV 97.0 95.9  PLT  --  137*    Cardiac Enzymes:  Recent Labs Lab 05/23/14 2325 05/24/14 0534  TROPONINI <0.03 <0.03   BNP (last 3 results)  Recent Labs  05/23/14 2325   BNP 24.9    ProBNP (last 3 results) No results for input(s): PROBNP in the last 8760 hours.    CBG:  Recent Labs Lab 05/23/14 2222 05/24/14 0757  GLUCAP 79 225*    No results found for this or any previous visit (from the past 240 hour(s)).   Studies: Dg Chest 2 View  05/23/2014   CLINICAL DATA:  Shortness of breath and weakness  EXAM: CHEST  2 VIEW  COMPARISON:  08/31/2004  FINDINGS: Cardiac shadow is within normal limits. The lungs are well aerated bilaterally. Very mild changes are noted in the right middle lobe consistent with acute infiltrate. No acute bony abnormality is noted.  IMPRESSION: Right middle lobe infiltrate.   Electronically Signed   By: Alcide CleverMark  Lukens M.D.   On: 05/23/2014 16:08    Scheduled Meds: . aspirin EC  81 mg Oral Daily  . azithromycin  500 mg Intravenous Q24H  . cefTRIAXone (ROCEPHIN)  IV  1 g Intravenous Q24H  . dextromethorphan-guaiFENesin  1 tablet Oral BID  . heparin subcutaneous  5,000 Units Subcutaneous 3 times per day  . HYDROcodone-homatropine  10 mL Oral QID  . insulin aspart  0-5 Units Subcutaneous QHS  . insulin aspart  0-9 Units Subcutaneous TID WC  . ipratropium-albuterol  3 mL Nebulization QID  . multivitamin with minerals  1 tablet Oral Daily  . niacin  500 mg Oral Daily  . nicotine  21 mg Transdermal Daily  . oseltamivir  75 mg Oral BID  . predniSONE  40 mg Oral Q breakfast  . simvastatin  20 mg Oral QHS   Continuous Infusions: . sodium chloride 100 mL/hr at 05/24/14 [id number]    Principal Problem:   Sepsis due to pneumonia Active Problems:   Influenza A with pneumonia   COPD exacerbation   Diabetes type 2, uncontrolled   HLD (hyperlipidemia)   Tobacco abuse   Essential hypertension    Time spent: 40 minutes   [facility],Nori Poland  [facility] Pager [phone]. If 7PM-7AM, please contact night-coverage at [email or web address], password [email or web address] 05/24/2014, 10:13 AM  LOS: 1 day

## 2014-05-24 NOTE — Plan of Care (Signed)
Problem: Phase I Progression Outcomes Goal: Initial discharge plan identified Outcome: Completed/Met Date Met:  05/24/14 To return home

## 2014-05-24 NOTE — Progress Notes (Signed)
[name] is having some pain in his chest, he states is from coughing or may be indigestion. Dr. Susie CassetteAbrol text paged to prn medication order.  Will continue to monitor & await for return call or new orders.  Forbes Cellarelcine Murad Staples, RN

## 2014-05-25 LAB — GLUCOSE, CAPILLARY
GLUCOSE-CAPILLARY: 335 mg/dL — AB (ref 70–99)
Glucose-Capillary: 180 mg/dL — ABNORMAL HIGH (ref 70–99)
Glucose-Capillary: 193 mg/dL — ABNORMAL HIGH (ref 70–99)

## 2014-05-25 MED ORDER — LISINOPRIL-HYDROCHLOROTHIAZIDE 20-12.5 MG PO TABS: 1.0000 | ORAL_TABLET | Freq: Every day | ORAL | Status: AC

## 2014-05-25 MED ORDER — DM-GUAIFENESIN ER 30-600 MG PO TB12: 1.0000 | ORAL_TABLET | Freq: Two times a day (BID) | ORAL | Status: DC

## 2014-05-25 MED ORDER — LEVOFLOXACIN 500 MG PO TABS: 500.0000 mg | ORAL_TABLET | Freq: Every day | ORAL | Status: DC

## 2014-05-25 MED ORDER — HYDROCODONE-HOMATROPINE 5-1.5 MG/5ML PO SYRP: 10.0000 mL | ORAL_SOLUTION | Freq: Four times a day (QID) | ORAL | Status: DC

## 2014-05-25 MED ORDER — NICOTINE 21 MG/24HR TD PT24: 21.0000 mg | MEDICATED_PATCH | Freq: Every day | TRANSDERMAL | Status: DC

## 2014-05-25 MED ORDER — INSULIN GLARGINE 100 UNIT/ML ~~LOC~~ SOLN: 45.0000 [IU] | Freq: Every day | SUBCUTANEOUS | Status: DC

## 2014-05-25 MED ORDER — OSELTAMIVIR PHOSPHATE 75 MG PO CAPS: 75.0000 mg | ORAL_CAPSULE | Freq: Two times a day (BID) | ORAL | Status: DC

## 2014-05-25 MED ORDER — PREDNISONE 5 MG PO TABS: ORAL_TABLET | ORAL | Status: DC

## 2014-05-25 NOTE — Discharge Summary (Signed)
Physician Discharge Summary  Jeffrey Stevens MRN: [id number] DOB/AGE: 23-Nov-1950 64 y.o.  PCP: Elyn Peers, MD   Admit date: 05/23/2014 Discharge date: 05/25/2014  Discharge Diagnoses:   Principal Problem:   Sepsis due to pneumonia Active Problems:   Influenza A with pneumonia   COPD exacerbation   Diabetes type 2, uncontrolled   HLD (hyperlipidemia)   Tobacco abuse   Essential hypertension   CAP (community acquired pneumonia)  Follow-up recommendations  follow-up with PCP in 5-7 days Follow-up CBC, BMP in one week Recommend change insulin dose from 45 units to 30 units after steroids are completed    Medication List    TAKE these medications        ACCU-CHEK SOFTCLIX LANCETS lancets     aspirin 81 MG tablet  Take 81 mg by mouth daily.     CYCLOSET 0.8 MG Tabs  Generic drug:  Bromocriptine Mesylate  Take 4 tablets by mouth every morning.     dextromethorphan-guaiFENesin 30-600 MG per 12 hr tablet  Commonly known as:  MUCINEX DM  Take 1 tablet by mouth 2 (two) times daily.     HYDROcodone-homatropine 5-1.5 MG/5ML syrup  Commonly known as:  HYCODAN  Take 10 mLs by mouth QID.     insulin glargine 100 UNIT/ML injection  Commonly known as:  LANTUS  Inject 0.45 mLs (45 Units total) into the skin at bedtime.     INTEGRA PLUS Caps  TAKE 1 CAPSULE BY MOUTH EVERY MORNING.     KOMBIGLYZE XR 2.06-998 MG Tb24  Generic drug:  Saxagliptin-Metformin  Take 1 tablet by mouth every morning.     lisinopril-hydrochlorothiazide 20-12.5 MG per tablet  Commonly known as:  PRINZIDE,ZESTORETIC  Take 1 tablet by mouth daily.  Start taking on:  06/01/2014     multivitamin tablet  Take 1 tablet by mouth daily.     niacin-simvastatin 500-20 MG 24 hr tablet  Commonly known as:  SIMCOR  Take 1 tablet by mouth at bedtime.     nicotine 21 mg/24hr patch  Commonly known as:  NICODERM CQ - dosed in mg/24 hours  Place 1 patch (21 mg total) onto the skin daily.     ONE TOUCH  ULTRA TEST test strip  Generic drug:  glucose blood     oseltamivir 75 MG capsule  Commonly known as:  TAMIFLU  Take 1 capsule (75 mg total) by mouth 2 (two) times daily.     predniSONE 5 MG tablet  Commonly known as:  DELTASONE  - 6 tablets 3 days  - 5 tablets 3 days  - 4 tablets 3 days  - 3 tablets 3 days  - 2 tablets 3 days  - 1 tablet 3 days then discontinue        Discharge Condition:  Disposition: Final discharge disposition not confirmed   Consults: None   Significant Diagnostic Studies: Dg Chest 2 View  05/23/2014   CLINICAL DATA:  Shortness of breath and weakness  EXAM: CHEST  2 VIEW  COMPARISON:  08/31/2004  FINDINGS: Cardiac shadow is within normal limits. The lungs are well aerated bilaterally. Very mild changes are noted in the right middle lobe consistent with acute infiltrate. No acute bony abnormality is noted.  IMPRESSION: Right middle lobe infiltrate.   Electronically Signed   By: Inez Catalina M.D.   On: 05/23/2014 16:08      Microbiology: Recent Results (from the past 240 hour(s))  Culture, blood (routine x 2)  Status: None (Preliminary result)   Collection Time: 05/23/14  8:00 PM  Result Value Ref Range Status   Specimen Description BLOOD RIGHT ARM  Final   Special Requests BOTTLES DRAWN AEROBIC AND ANAEROBIC 10 CC EACH  Final   Culture   Final           BLOOD CULTURE RECEIVED NO GROWTH TO DATE CULTURE WILL BE HELD FOR 5 DAYS BEFORE ISSUING A FINAL NEGATIVE REPORT Performed at [facility]    Report Status PENDING  Incomplete  Culture, blood (routine x 2)     Status: None (Preliminary result)   Collection Time: 05/23/14  8:07 PM  Result Value Ref Range Status   Specimen Description BLOOD RIGHT ANTECUBITAL  Final   Special Requests BOTTLES DRAWN AEROBIC AND ANAEROBIC 10 CC EACH  Final   Culture   Final           BLOOD CULTURE RECEIVED NO GROWTH TO DATE CULTURE WILL BE HELD FOR 5 DAYS BEFORE ISSUING A FINAL NEGATIVE  REPORT Performed at [facility]    Report Status PENDING  Incomplete     Labs: Results for orders placed or performed during the hospital encounter of 05/23/14 (from the past 48 hour(s))  Comprehensive metabolic panel     Status: Abnormal   Collection Time: 05/23/14  6:55 PM  Result Value Ref Range   Sodium 137 135 - 145 mmol/L   Potassium 3.3 (L) 3.5 - 5.1 mmol/L   Chloride 101 96 - 112 mmol/L   CO2 24 19 - 32 mmol/L   Glucose, Bld 80 70 - 99 mg/dL   BUN 19 6 - 23 mg/dL   Creatinine, Ser 1.53 (H) 0.50 - 1.35 mg/dL   Calcium 8.9 8.4 - 10.5 mg/dL   Total Protein 6.5 6.0 - 8.3 g/dL   Albumin 3.6 3.5 - 5.2 g/dL   AST 50 (H) 0 - 37 U/L   ALT 23 0 - 53 U/L   Alkaline Phosphatase 42 39 - 117 U/L   Total Bilirubin 0.9 0.3 - 1.2 mg/dL   GFR calc non Af Amer 46 (L) >90 mL/min   GFR calc Af Amer 53 (L) >90 mL/min    Comment: (NOTE) The eGFR has been calculated using the CKD EPI equation. This calculation has not been validated in all clinical situations. eGFR's persistently <90 mL/min signify possible Chronic Kidney Disease.    Anion gap 12 5 - 15  CBC with Differential/Platelet     Status: Abnormal   Collection Time: 05/23/14  6:55 PM  Result Value Ref Range   WBC 5.2 4.0 - 10.5 K/uL   RBC 3.89 (L) 4.22 - 5.81 MIL/uL   Hemoglobin 12.4 (L) 13.0 - 17.0 g/dL   HCT 37.3 (L) 39.0 - 52.0 %   MCV 95.9 78.0 - 100.0 fL   MCH 31.9 26.0 - 34.0 pg   MCHC 33.2 30.0 - 36.0 g/dL   RDW 16.0 (H) 11.5 - 15.5 %   Platelets 137 (L) 150 - 400 K/uL   Neutrophils Relative % 79 (H) 43 - 77 %   Lymphocytes Relative 13 12 - 46 %   Monocytes Relative 8 3 - 12 %   Eosinophils Relative 0 0 - 5 %   Basophils Relative 0 0 - 1 %   Neutro Abs 4.1 1.7 - 7.7 K/uL   Lymphs Abs 0.7 0.7 - 4.0 K/uL   Monocytes Absolute 0.4 0.1 - 1.0 K/uL   Eosinophils Absolute 0.0 0.0 -  0.7 K/uL   Basophils Absolute 0.0 0.0 - 0.1 K/uL   WBC Morphology ATYPICAL LYMPHOCYTES   Lactic acid, plasma     Status: None    Collection Time: 05/23/14  6:55 PM  Result Value Ref Range   Lactic Acid, Venous 0.7 0.5 - 2.0 mmol/L  Magnesium     Status: None   Collection Time: 05/23/14  6:55 PM  Result Value Ref Range   Magnesium 1.9 1.5 - 2.5 mg/dL  Lipid panel     Status: None   Collection Time: 05/23/14  6:55 PM  Result Value Ref Range   Cholesterol 106 0 - 200 mg/dL   Triglycerides 35 <888 mg/dL   HDL 41 >41 mg/dL   Total CHOL/HDL Ratio 2.6 RATIO   VLDL 7 0 - 40 mg/dL   LDL Cholesterol 58 0 - 99 mg/dL    Comment:        Total Cholesterol/HDL:CHD Risk Coronary Heart Disease Risk Table                     Men   Women  1/2 Average Risk   3.4   3.3  Average Risk       5.0   4.4  2 X Average Risk   9.6   7.1  3 X Average Risk  23.4   11.0        Use the calculated Patient Ratio above and the CHD Risk Table to determine the patient's CHD Risk.        ATP III CLASSIFICATION (LDL):  <100     mg/dL   Optimal  495-398  mg/dL   Near or Above                    Optimal  130-159  mg/dL   Borderline  405-864  mg/dL   High  >105     mg/dL   Very High   Rapid HIV screen (HIV 1/2 Ab+Ag)     Status: None   Collection Time: 05/23/14  6:55 PM  Result Value Ref Range   HIV-1 P24 Antigen - HIV24 NON REACTIVE NON REACTIVE   HIV 1/2 Antibodies NON REACTIVE NON REACTIVE   Interpretation (HIV Ag Ab)      A non reactive test result means that HIV 1 or HIV 2 antibodies and HIV 1 p24 antigen were not detected in the specimen.  Culture, blood (routine x 2)     Status: None (Preliminary result)   Collection Time: 05/23/14  8:00 PM  Result Value Ref Range   Specimen Description BLOOD RIGHT ARM    Special Requests BOTTLES DRAWN AEROBIC AND ANAEROBIC 10 CC EACH    Culture             BLOOD CULTURE RECEIVED NO GROWTH TO DATE CULTURE WILL BE HELD FOR 5 DAYS BEFORE ISSUING A FINAL NEGATIVE REPORT Performed at [facility]    Report Status PENDING   Culture, blood (routine x 2)     Status: None (Preliminary result)    Collection Time: 05/23/14  8:07 PM  Result Value Ref Range   Specimen Description BLOOD RIGHT ANTECUBITAL    Special Requests BOTTLES DRAWN AEROBIC AND ANAEROBIC 10 CC EACH    Culture             BLOOD CULTURE RECEIVED NO GROWTH TO DATE CULTURE WILL BE HELD FOR 5 DAYS BEFORE ISSUING A FINAL NEGATIVE REPORT Performed at [facility]  Report Status PENDING   Glucose, capillary     Status: None   Collection Time: 05/23/14 10:22 PM  Result Value Ref Range   Glucose-Capillary 79 70 - 99 mg/dL  Troponin I (q 6hr x 3)     Status: None   Collection Time: 05/23/14 11:25 PM  Result Value Ref Range   Troponin I <0.03 <0.031 ng/mL    Comment:        NO INDICATION OF MYOCARDIAL INJURY.   Brain natriuretic peptide     Status: None   Collection Time: 05/23/14 11:25 PM  Result Value Ref Range   B Natriuretic Peptide 24.9 0.0 - 100.0 pg/mL  Basic metabolic panel     Status: Abnormal   Collection Time: 05/24/14  5:34 AM  Result Value Ref Range   Sodium 136 135 - 145 mmol/L   Potassium 3.9 3.5 - 5.1 mmol/L   Chloride 105 96 - 112 mmol/L   CO2 27 19 - 32 mmol/L   Glucose, Bld 218 (H) 70 - 99 mg/dL   BUN 19 6 - 23 mg/dL   Creatinine, Ser 1.22 0.50 - 1.35 mg/dL   Calcium 8.5 8.4 - 10.5 mg/dL   GFR calc non Af Amer 61 (L) >90 mL/min   GFR calc Af Amer 70 (L) >90 mL/min    Comment: (NOTE) The eGFR has been calculated using the CKD EPI equation. This calculation has not been validated in all clinical situations. eGFR's persistently <90 mL/min signify possible Chronic Kidney Disease.    Anion gap 4 (L) 5 - 15  Troponin I (q 6hr x 3)     Status: None   Collection Time: 05/24/14  5:34 AM  Result Value Ref Range   Troponin I <0.03 <0.031 ng/mL    Comment:        NO INDICATION OF MYOCARDIAL INJURY.   Glucose, capillary     Status: Abnormal   Collection Time: 05/24/14  7:57 AM  Result Value Ref Range   Glucose-Capillary 225 (H) 70 - 99 mg/dL  Troponin I (q 6hr x 3)     Status:  None   Collection Time: 05/24/14 12:10 PM  Result Value Ref Range   Troponin I <0.03 <0.031 ng/mL    Comment:        NO INDICATION OF MYOCARDIAL INJURY.   Glucose, capillary     Status: Abnormal   Collection Time: 05/24/14 12:15 PM  Result Value Ref Range   Glucose-Capillary 310 (H) 70 - 99 mg/dL  Glucose, capillary     Status: Abnormal   Collection Time: 05/24/14  5:21 PM  Result Value Ref Range   Glucose-Capillary 359 (H) 70 - 99 mg/dL  Glucose, capillary     Status: Abnormal   Collection Time: 05/24/14 11:00 PM  Result Value Ref Range   Glucose-Capillary 180 (H) 70 - 99 mg/dL  Glucose, capillary     Status: Abnormal   Collection Time: 05/25/14  8:40 AM  Result Value Ref Range   Glucose-Capillary 193 (H) 70 - 99 mg/dL  Glucose, capillary     Status: Abnormal   Collection Time: 05/25/14 12:10 PM  Result Value Ref Range   Glucose-Capillary 335 (H) 70 - 99 mg/dL     HPI Jeffrey Stevens is a 64 y.o. BM PMHx  He is a pt of Dr. Criss Rosales, PMHx HTN, HLD, diabetes type 2 uncontrolled (hemoglobin A1c last week 8.1), tobacco abuse. [facility] [address] [place].  New patient with illness. 3 days ago he noted onset of illness with cough, thenfever. He has gotten worse with continued fever and more severe cough (productive unknown color), weakness, fatigue, and chills, positive sore throat. Poor appetite, some post- tussive emesis but OW no GI symptoms He did have a flu shot this year.Pneumonia and shingles shot obtained last week. No previous history of cardiac problems per patient.   HOSPITAL COURSE:   Sepsis /community-acquired pneumonia/influenza A positive Treated with Rocephin/azithromycin, switched to Levaquin for another 5 days Continue Tamiflu, 4 more days Patient hydrated with normal saline under 170ml/hr -DuoNeb QID Initially started on Solu-Medrol, switched to by mouth prednisone Hycodan syrup, Mucinex DM, -Flutter  valve -Respiratory virus panel pending -Blood culture pending HIV antibody negative  Chest pain secondary to pneumonia/pleurisy DC 2-D echo Cardiac enzymes negative 3  COPD exacerbation -See sepsis pneumonia -Nicotine patch  Diabetes type 2 uncontrolled -Lipid panel within normal limits-continue Niaspan -Carb modified diet Dose of insulin increased to 45 units as the patient is on steroids   HTN -Patient's BP soft will hold home BP meds at this time  HLD? -See diabetes type 2 uncontrolled -Continue Zocor 20 mg daily  Tobacco abuse -Nicotine patch    Discharge Exam: Blood pressure 108/61, pulse 67, temperature 97.7 F (36.5 C), temperature source Oral, resp. rate 16, height $RemoveBe'5\' 9"'mruCnaktA$  (1.753 m), weight 67.9 kg (149 lb 11.1 oz), SpO2 96 %.  General: A/O 4, acute respiratory distress,  Eyes: Pupils equal reactive to light and accommodation  ENT: Left anterior tonsillar pillar swelling/erythema/negative discharge  Neck: Negative JVD, negative lymphadenopathy  Cardiovascular: Regular rhythm and rate, negative murmurs rubs or gallops, normal S1/S2  Respiratory: Diffuse mild expiratory wheezing  Abdomen: Soft, nontender, nondistended, plus bowel sound  Skin: NA  Musculoskeletal: NA  Neurologic: Cranial nerves II through XII intact, moves all extremities        Discharge Instructions    Diet - low sodium heart healthy    Complete by:  As directed      Diet - low sodium heart healthy    Complete by:  As directed      Increase activity slowly    Complete by:  As directed      Increase activity slowly    Complete by:  As directed              Signed: Izick Stevens 05/25/2014, 1:00 PM

## 2014-05-25 NOTE — Discharge Instructions (Signed)
°  Jeffrey Stevens  Admit date: 05/23/2014 Discharge date: 05/25/2014  Can go back to work  05/29/14  Please call [phone] for any questions

## 2014-05-26 LAB — HEMOGLOBIN A1C
Hgb A1c MFr Bld: 8.6 % — ABNORMAL HIGH (ref 4.8–5.6)
MEAN PLASMA GLUCOSE: 200 mg/dL

## 2014-05-27 LAB — RESPIRATORY VIRUS PANEL
ADENOVIRUS: NEGATIVE
Influenza A: POSITIVE — AB
Influenza B: NEGATIVE
Metapneumovirus: NEGATIVE
PARAINFLUENZA 1 A: NEGATIVE
Parainfluenza 2: NEGATIVE
Parainfluenza 3: NEGATIVE
RESPIRATORY SYNCYTIAL VIRUS B: NEGATIVE
RHINOVIRUS: NEGATIVE
Respiratory Syncytial Virus A: NEGATIVE

## 2014-05-30 LAB — CULTURE, BLOOD (ROUTINE X 2)
Culture: NO GROWTH
Culture: NO GROWTH

## 2014-06-13 DIAGNOSIS — L602 Onychogryphosis: Secondary | ICD-10-CM | POA: Diagnosis not present

## 2014-06-13 DIAGNOSIS — G5761 Lesion of plantar nerve, right lower limb: Secondary | ICD-10-CM | POA: Diagnosis not present

## 2014-06-13 DIAGNOSIS — L84 Corns and callosities: Secondary | ICD-10-CM | POA: Diagnosis not present

## 2014-06-13 DIAGNOSIS — E1151 Type 2 diabetes mellitus with diabetic peripheral angiopathy without gangrene: Secondary | ICD-10-CM | POA: Diagnosis not present

## 2014-07-07 ENCOUNTER — Other Ambulatory Visit: Payer: Self-pay | Admitting: Internal Medicine

## 2014-08-01 DIAGNOSIS — H2513 Age-related nuclear cataract, bilateral: Secondary | ICD-10-CM | POA: Diagnosis not present

## 2014-08-01 DIAGNOSIS — H35031 Hypertensive retinopathy, right eye: Secondary | ICD-10-CM | POA: Diagnosis not present

## 2014-08-01 DIAGNOSIS — H2589 Other age-related cataract: Secondary | ICD-10-CM | POA: Diagnosis not present

## 2014-08-01 DIAGNOSIS — H25019 Cortical age-related cataract, unspecified eye: Secondary | ICD-10-CM | POA: Diagnosis not present

## 2014-08-01 DIAGNOSIS — H35032 Hypertensive retinopathy, left eye: Secondary | ICD-10-CM | POA: Diagnosis not present

## 2014-08-01 DIAGNOSIS — E119 Type 2 diabetes mellitus without complications: Secondary | ICD-10-CM | POA: Diagnosis not present

## 2014-08-29 DIAGNOSIS — L602 Onychogryphosis: Secondary | ICD-10-CM | POA: Diagnosis not present

## 2014-08-29 DIAGNOSIS — E1151 Type 2 diabetes mellitus with diabetic peripheral angiopathy without gangrene: Secondary | ICD-10-CM | POA: Diagnosis not present

## 2014-08-29 DIAGNOSIS — L84 Corns and callosities: Secondary | ICD-10-CM | POA: Diagnosis not present

## 2014-09-15 DIAGNOSIS — I10 Essential (primary) hypertension: Secondary | ICD-10-CM | POA: Diagnosis not present

## 2014-09-15 DIAGNOSIS — E08319 Diabetes mellitus due to underlying condition with unspecified diabetic retinopathy without macular edema: Secondary | ICD-10-CM | POA: Diagnosis not present

## 2014-09-15 DIAGNOSIS — D649 Anemia, unspecified: Secondary | ICD-10-CM | POA: Diagnosis not present

## 2014-10-12 ENCOUNTER — Other Ambulatory Visit: Payer: Self-pay | Admitting: Internal Medicine

## 2014-10-25 ENCOUNTER — Other Ambulatory Visit: Payer: Self-pay | Admitting: Internal Medicine

## 2014-11-07 DIAGNOSIS — E1151 Type 2 diabetes mellitus with diabetic peripheral angiopathy without gangrene: Secondary | ICD-10-CM | POA: Diagnosis not present

## 2014-11-07 DIAGNOSIS — L602 Onychogryphosis: Secondary | ICD-10-CM | POA: Diagnosis not present

## 2014-11-07 DIAGNOSIS — L84 Corns and callosities: Secondary | ICD-10-CM | POA: Diagnosis not present

## 2014-11-12 ENCOUNTER — Telehealth: Payer: Self-pay | Admitting: Internal Medicine

## 2014-11-12 NOTE — Telephone Encounter (Signed)
Due to 9/27 PAL appointments for 9/23 lab moved to 10/17 and 9/27 f/u moved to 10/24. Left message for patient re change and new dates/times. Schedule mailed.

## 2014-11-14 ENCOUNTER — Other Ambulatory Visit: Payer: Medicare Other

## 2014-11-18 ENCOUNTER — Ambulatory Visit: Payer: Medicare Other | Admitting: Internal Medicine

## 2014-12-08 ENCOUNTER — Other Ambulatory Visit: Payer: Medicare Other

## 2014-12-12 ENCOUNTER — Other Ambulatory Visit (HOSPITAL_BASED_OUTPATIENT_CLINIC_OR_DEPARTMENT_OTHER): Payer: BLUE CROSS/BLUE SHIELD

## 2014-12-12 DIAGNOSIS — D509 Iron deficiency anemia, unspecified: Secondary | ICD-10-CM

## 2014-12-12 DIAGNOSIS — D539 Nutritional anemia, unspecified: Secondary | ICD-10-CM

## 2014-12-12 LAB — CBC WITH DIFFERENTIAL/PLATELET
BASO%: 0.7 % (ref 0.0–2.0)
Basophils Absolute: 0 10*3/uL (ref 0.0–0.1)
EOS%: 4.7 % (ref 0.0–7.0)
Eosinophils Absolute: 0.3 10*3/uL (ref 0.0–0.5)
HCT: 37.8 % — ABNORMAL LOW (ref 38.4–49.9)
HGB: 12.8 g/dL — ABNORMAL LOW (ref 13.0–17.1)
LYMPH%: 28.3 % (ref 14.0–49.0)
MCH: 31.5 pg (ref 27.2–33.4)
MCHC: 33.8 g/dL (ref 32.0–36.0)
MCV: 93.2 fL (ref 79.3–98.0)
MONO#: 0.5 10*3/uL (ref 0.1–0.9)
MONO%: 8.1 % (ref 0.0–14.0)
NEUT#: 3.7 10*3/uL (ref 1.5–6.5)
NEUT%: 58.2 % (ref 39.0–75.0)
PLATELETS: 191 10*3/uL (ref 140–400)
RBC: 4.06 10*6/uL — ABNORMAL LOW (ref 4.20–5.82)
RDW: 15.9 % — ABNORMAL HIGH (ref 11.0–14.6)
WBC: 6.3 10*3/uL (ref 4.0–10.3)
lymph#: 1.8 10*3/uL (ref 0.9–3.3)

## 2014-12-12 LAB — IRON AND TIBC CHCC
%SAT: 25 % (ref 20–55)
Iron: 70 ug/dL (ref 42–163)
TIBC: 284 ug/dL (ref 202–409)
UIBC: 214 ug/dL (ref 117–376)

## 2014-12-12 LAB — FERRITIN CHCC: Ferritin: 24 ng/ml (ref 22–316)

## 2014-12-15 ENCOUNTER — Telehealth: Payer: Self-pay | Admitting: Internal Medicine

## 2014-12-15 ENCOUNTER — Ambulatory Visit (HOSPITAL_BASED_OUTPATIENT_CLINIC_OR_DEPARTMENT_OTHER): Payer: Medicare Other | Admitting: Internal Medicine

## 2014-12-15 ENCOUNTER — Encounter: Payer: Self-pay | Admitting: Internal Medicine

## 2014-12-15 VITALS — BP 118/62 | HR 76 | Temp 98.8°F | Resp 18 | Ht 69.0 in | Wt 151.8 lb

## 2014-12-15 DIAGNOSIS — D539 Nutritional anemia, unspecified: Secondary | ICD-10-CM

## 2014-12-15 DIAGNOSIS — D509 Iron deficiency anemia, unspecified: Secondary | ICD-10-CM

## 2014-12-15 NOTE — Progress Notes (Signed)
[facility] [phone]   [phone]  OFFICE PROGRESS NOTE  Jeffrey PitterBLAND,Jeffrey J, MD [address] [place] [id number]  DIAGNOSIS: Microcytic anemia secondary to iron deficiency  PRIOR THERAPY: Status post Feraheme infusion 510 MG IV 2 doses last dose was given on 03/28/2014  CURRENT THERAPY: Integra plus 1 capsule by mouth daily.  INTERVAL HISTORY: Jeffrey Stevens 64 y.o. male returns to the clinic today for six-month followup visit accompanied by his wife. He has no complaints today. He denied having any significant fatigue or weakness. He denied having any dizzy spells. He denied having any chest pain, shortness of breath, cough or hemoptysis. He denied having any significant bleeding issues, bruises or ecchymosis. He is tolerating his treatment with Integra plus fairly well. He has repeat CBC performed and iron study and ferritin performed recently and he is here for evaluation and discussion of his lab results.  MEDICAL HISTORY: Past Medical History  Diagnosis Date  . Diabetes mellitus without complication (HCC)   . Hypertension   . Dyslipidemia   . Pneumonia 05/23/2014    ALLERGIES:  has No Known Allergies.  MEDICATIONS:  Current Outpatient Prescriptions  Medication Sig Dispense Refill  . ACCU-CHEK SOFTCLIX LANCETS lancets     . aspirin 81 MG tablet Take 81 mg by mouth daily.    . Bromocriptine Mesylate (CYCLOSET) 0.8 MG TABS Take 4 tablets by mouth every morning.    Marland Kitchen. FeFum-FePoly-FA-B Cmp-C-Biot (INTEGRA PLUS) CAPS TAKE 1 CAPSULE BY MOUTH EVERY MORNING. 30 capsule 2  . lisinopril-hydrochlorothiazide (PRINZIDE,ZESTORETIC) 20-12.5 MG per tablet Take 1 tablet by mouth daily. 30 tablet 0  . Multiple Vitamin (MULTIVITAMIN) tablet Take 1 tablet by mouth daily.    . niacin (NIASPAN) 500 MG CR tablet   3  . ONE TOUCH ULTRA TEST test strip     . Saxagliptin-Metformin (KOMBIGLYZE XR) 2.06-998 MG TB24 Take 1 tablet by mouth every morning.      . simvastatin (ZOCOR) 20 MG tablet   3  . niacin-simvastatin (SIMCOR) 500-20 MG 24 hr tablet Take 1 tablet by mouth at bedtime.     Current Facility-Administered Medications  Medication Dose Route Frequency Provider Last Rate Last Dose  . acetaminophen (TYLENOL) tablet 975 mg  975 mg Oral Once Pearline CablesJessica C Copland, MD       REVIEW OF SYSTEMS:  A comprehensive review of systems was negative.   PHYSICAL EXAMINATION: General appearance: alert, cooperative and no distress Head: Normocephalic, without obvious abnormality, atraumatic Neck: no adenopathy, no JVD, supple, symmetrical, trachea midline and thyroid not enlarged, symmetric, no tenderness/mass/nodules Lymph nodes: Cervical, supraclavicular, and axillary nodes normal. Resp: clear to auscultation bilaterally Back: symmetric, no curvature. ROM normal. No CVA tenderness. Cardio: regular rate and rhythm, S1, S2 normal, no murmur, click, rub or gallop GI: soft, non-tender; bowel sounds normal; no masses,  no organomegaly Extremities: extremities normal, atraumatic, no cyanosis or edema  ECOG PERFORMANCE STATUS: 0 - Asymptomatic  Blood pressure 118/62, pulse 76, temperature 98.8 F (37.1 C), temperature source Oral, resp. rate 18, height 5\' 9"  (1.753 m), weight 151 lb 12.8 oz (68.856 kg), SpO2 100 %.  LABORATORY DATA: Lab Results  Component Value Date   WBC 6.3 12/12/2014   HGB 12.8* 12/12/2014   HCT 37.8* 12/12/2014   MCV 93.2 12/12/2014   PLT 191 12/12/2014      Chemistry      Component Value Date/Time   NA 136 05/24/2014 0534  NA 139 06/05/2013 1325   K 3.9 05/24/2014 0534   K 4.1 06/05/2013 1325   CL 105 05/24/2014 0534   CO2 27 05/24/2014 0534   CO2 26 06/05/2013 1325   BUN 19 05/24/2014 0534   BUN 12.7 06/05/2013 1325   CREATININE 1.22 05/24/2014 0534   CREATININE 1.1 06/05/2013 1325      Component Value Date/Time   CALCIUM 8.5 05/24/2014 0534   CALCIUM 9.5 06/05/2013 1325   ALKPHOS 42 05/23/2014 1855    ALKPHOS 57 06/05/2013 1325   AST 50* 05/23/2014 1855   AST 20 06/05/2013 1325   ALT 23 05/23/2014 1855   ALT 11 06/05/2013 1325   BILITOT 0.9 05/23/2014 1855   BILITOT 0.69 06/05/2013 1325     Other lab results: Serum ferritin 24, serum iron 70, total iron binding capacity 284 and iron saturation 25%.  RADIOGRAPHIC STUDIES: No results found.  ASSESSMENT AND PLAN: This very pleasant 64 years old African American male with microcytic anemia secondary to iron deficiency of unclear etiology.  Status post Feraheme infusion and he has significant improvement in his hemoglobin and hematocrit as well as the iron study and ferritin. He is currently on Integra plus and tolerating it fairly well. His recent CBC showed mild anemia and his iron study and ferritin are within the normal range I discussed the lab result with the patient and his wife and recommended for him to continue on the oral iron tablets for now I would see him back for followup visit in 6 months with repeat CBC, iron study and ferritin. He was advised to call immediately if he has any concerning symptoms in the interval. The patient voices understanding of current disease status and treatment options and is in agreement with the current care plan.  All questions were answered. The patient knows to call the clinic with any problems, questions or concerns. We can certainly see the patient much sooner if necessary.  Disclaimer: This note was dictated with voice recognition software. Similar sounding words can inadvertently be transcribed and may not be corrected upon review.

## 2014-12-15 NOTE — Telephone Encounter (Signed)
per pof to sch pt appt-gave tp copy of avs °

## 2015-01-12 DIAGNOSIS — E782 Mixed hyperlipidemia: Secondary | ICD-10-CM | POA: Diagnosis not present

## 2015-01-12 DIAGNOSIS — E08319 Diabetes mellitus due to underlying condition with unspecified diabetic retinopathy without macular edema: Secondary | ICD-10-CM | POA: Diagnosis not present

## 2015-01-12 DIAGNOSIS — I10 Essential (primary) hypertension: Secondary | ICD-10-CM | POA: Diagnosis not present

## 2015-01-12 DIAGNOSIS — Z23 Encounter for immunization: Secondary | ICD-10-CM | POA: Diagnosis not present

## 2015-04-03 DIAGNOSIS — E1351 Other specified diabetes mellitus with diabetic peripheral angiopathy without gangrene: Secondary | ICD-10-CM | POA: Diagnosis not present

## 2015-04-03 DIAGNOSIS — L602 Onychogryphosis: Secondary | ICD-10-CM | POA: Diagnosis not present

## 2015-04-03 DIAGNOSIS — L84 Corns and callosities: Secondary | ICD-10-CM | POA: Diagnosis not present

## 2015-05-01 DIAGNOSIS — H35031 Hypertensive retinopathy, right eye: Secondary | ICD-10-CM | POA: Diagnosis not present

## 2015-05-01 DIAGNOSIS — H25012 Cortical age-related cataract, left eye: Secondary | ICD-10-CM | POA: Diagnosis not present

## 2015-05-01 DIAGNOSIS — Z794 Long term (current) use of insulin: Secondary | ICD-10-CM | POA: Diagnosis not present

## 2015-05-01 DIAGNOSIS — H2512 Age-related nuclear cataract, left eye: Secondary | ICD-10-CM | POA: Diagnosis not present

## 2015-05-01 DIAGNOSIS — E119 Type 2 diabetes mellitus without complications: Secondary | ICD-10-CM | POA: Diagnosis not present

## 2015-05-01 DIAGNOSIS — H35032 Hypertensive retinopathy, left eye: Secondary | ICD-10-CM | POA: Diagnosis not present

## 2015-05-01 DIAGNOSIS — H25019 Cortical age-related cataract, unspecified eye: Secondary | ICD-10-CM | POA: Diagnosis not present

## 2015-05-01 DIAGNOSIS — H2513 Age-related nuclear cataract, bilateral: Secondary | ICD-10-CM | POA: Diagnosis not present

## 2015-05-19 DIAGNOSIS — H2512 Age-related nuclear cataract, left eye: Secondary | ICD-10-CM | POA: Diagnosis not present

## 2015-05-22 ENCOUNTER — Telehealth: Payer: Self-pay | Admitting: Internal Medicine

## 2015-05-22 NOTE — Telephone Encounter (Signed)
pt called to r/s appt from april to may due to cataract surgery...done....pt ok and aware of new d.t

## 2015-06-08 DIAGNOSIS — H2511 Age-related nuclear cataract, right eye: Secondary | ICD-10-CM | POA: Diagnosis not present

## 2015-06-08 DIAGNOSIS — H25011 Cortical age-related cataract, right eye: Secondary | ICD-10-CM | POA: Diagnosis not present

## 2015-06-11 DIAGNOSIS — E08319 Diabetes mellitus due to underlying condition with unspecified diabetic retinopathy without macular edema: Secondary | ICD-10-CM | POA: Diagnosis not present

## 2015-06-11 DIAGNOSIS — I10 Essential (primary) hypertension: Secondary | ICD-10-CM | POA: Diagnosis not present

## 2015-06-11 DIAGNOSIS — E782 Mixed hyperlipidemia: Secondary | ICD-10-CM | POA: Diagnosis not present

## 2015-06-12 ENCOUNTER — Other Ambulatory Visit: Payer: Medicare Other

## 2015-06-15 DIAGNOSIS — D649 Anemia, unspecified: Secondary | ICD-10-CM | POA: Diagnosis not present

## 2015-06-15 DIAGNOSIS — E782 Mixed hyperlipidemia: Secondary | ICD-10-CM | POA: Diagnosis not present

## 2015-06-15 DIAGNOSIS — I10 Essential (primary) hypertension: Secondary | ICD-10-CM | POA: Diagnosis not present

## 2015-06-15 DIAGNOSIS — E08319 Diabetes mellitus due to underlying condition with unspecified diabetic retinopathy without macular edema: Secondary | ICD-10-CM | POA: Diagnosis not present

## 2015-06-16 DIAGNOSIS — H2511 Age-related nuclear cataract, right eye: Secondary | ICD-10-CM | POA: Diagnosis not present

## 2015-06-17 ENCOUNTER — Ambulatory Visit: Payer: Medicare Other | Admitting: Internal Medicine

## 2015-07-03 DIAGNOSIS — L602 Onychogryphosis: Secondary | ICD-10-CM | POA: Diagnosis not present

## 2015-07-03 DIAGNOSIS — L84 Corns and callosities: Secondary | ICD-10-CM | POA: Diagnosis not present

## 2015-07-03 DIAGNOSIS — E1351 Other specified diabetes mellitus with diabetic peripheral angiopathy without gangrene: Secondary | ICD-10-CM | POA: Diagnosis not present

## 2015-07-10 ENCOUNTER — Other Ambulatory Visit (HOSPITAL_BASED_OUTPATIENT_CLINIC_OR_DEPARTMENT_OTHER): Payer: No Typology Code available for payment source

## 2015-07-10 DIAGNOSIS — D539 Nutritional anemia, unspecified: Secondary | ICD-10-CM

## 2015-07-10 DIAGNOSIS — D509 Iron deficiency anemia, unspecified: Secondary | ICD-10-CM

## 2015-07-10 LAB — CBC WITH DIFFERENTIAL/PLATELET
BASO%: 0.7 % (ref 0.0–2.0)
BASOS ABS: 0 10*3/uL (ref 0.0–0.1)
EOS ABS: 0.2 10*3/uL (ref 0.0–0.5)
EOS%: 3.2 % (ref 0.0–7.0)
HCT: 35.7 % — ABNORMAL LOW (ref 38.4–49.9)
HEMOGLOBIN: 12.1 g/dL — AB (ref 13.0–17.1)
LYMPH%: 34 % (ref 14.0–49.0)
MCH: 31.2 pg (ref 27.2–33.4)
MCHC: 33.9 g/dL (ref 32.0–36.0)
MCV: 92 fL (ref 79.3–98.0)
MONO#: 0.4 10*3/uL (ref 0.1–0.9)
MONO%: 7.2 % (ref 0.0–14.0)
NEUT#: 3.1 10*3/uL (ref 1.5–6.5)
NEUT%: 54.9 % (ref 39.0–75.0)
Platelets: 161 10*3/uL (ref 140–400)
RBC: 3.88 10*6/uL — ABNORMAL LOW (ref 4.20–5.82)
RDW: 14.7 % — ABNORMAL HIGH (ref 11.0–14.6)
WBC: 5.6 10*3/uL (ref 4.0–10.3)
lymph#: 1.9 10*3/uL (ref 0.9–3.3)

## 2015-07-11 LAB — IRON AND TIBC CHCC
IRON: 53 ug/dL (ref 38–169)
Iron Saturation: 17 % (ref 15–55)
Total Iron Binding Capacity: 309 ug/dL (ref 250–450)
UIBC: 256 ug/dL (ref 111–343)

## 2015-07-11 LAB — FERRITIN CHCC: Ferritin: 18 ng/mL — ABNORMAL LOW (ref 30–400)

## 2015-07-15 ENCOUNTER — Encounter: Payer: Self-pay | Admitting: Internal Medicine

## 2015-07-15 ENCOUNTER — Ambulatory Visit (HOSPITAL_BASED_OUTPATIENT_CLINIC_OR_DEPARTMENT_OTHER): Payer: Medicare Other | Admitting: Internal Medicine

## 2015-07-15 ENCOUNTER — Telehealth: Payer: Self-pay | Admitting: Internal Medicine

## 2015-07-15 VITALS — BP 132/66 | HR 77 | Temp 98.2°F | Resp 18 | Ht 69.0 in | Wt 150.3 lb

## 2015-07-15 DIAGNOSIS — D509 Iron deficiency anemia, unspecified: Secondary | ICD-10-CM | POA: Diagnosis not present

## 2015-07-15 DIAGNOSIS — D539 Nutritional anemia, unspecified: Secondary | ICD-10-CM

## 2015-07-15 NOTE — Telephone Encounter (Signed)
per pof to sch pt appt-gave pt copy of avs °

## 2015-07-15 NOTE — Progress Notes (Signed)
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  INTERVAL HISTORY: Jeffrey Stevens 65 y.o. male returns to the clinic today for six-month followup visit accompanied by his wife. The patient is feeling fine today was no specific complaints. He denied having any significant fatigue or weakness. He denied having any dizzy spells. He denied having any chest pain, shortness of breath, cough or hemoptysis. He denied having any significant bleeding issues, bruises or ecchymosis. He has been off treatment with Integra plus for several weeks because of change in insurance and lack of coverage for the Integra plus. He has repeat CBC performed and iron study and ferritin performed recently and he is here for evaluation and discussion of his lab results.  MEDICAL HISTORY: Past Medical History  Diagnosis Date  . Diabetes mellitus without complication (HCC)   . Hypertension   . Dyslipidemia   . Pneumonia 05/23/2014    ALLERGIES:  has No Known Allergies.  MEDICATIONS:  Current Outpatient Prescriptions  Medication Sig Dispense Refill  . ACCU-CHEK SOFTCLIX LANCETS lancets     . aspirin 81 MG tablet Take 81 mg by mouth daily.    . BD PEN NEEDLE NANO U/F 32G X 4 MM MISC See admin instructions.  4  . Bromocriptine Mesylate (CYCLOSET) 0.8 MG TABS Take 4 tablets by mouth every morning.    Marland Kitchen. FeFum-FePoly-FA-B Cmp-C-Biot (INTEGRA PLUS) CAPS TAKE 1 CAPSULE BY MOUTH EVERY MORNING. 30 capsule 2  . lisinopril-hydrochlorothiazide (PRINZIDE,ZESTORETIC) 20-12.5 MG per tablet Take 1 tablet by mouth daily. 30 tablet 0  . Multiple Vitamin (MULTIVITAMIN) tablet Take 1 tablet by mouth daily.     . ONE TOUCH ULTRA TEST test strip     . prednisoLONE acetate (PRED FORTE) 1 % ophthalmic suspension Place 1 drop into the right eye 4 (four) times daily.  1  . Saxagliptin-Metformin (KOMBIGLYZE XR) 2.06-998 MG TB24 Take 1 tablet by mouth every morning.     . simvastatin (ZOCOR) 20 MG tablet   3  . TOUJEO SOLOSTAR 300 UNIT/ML SOPN Inject 30 Units into the skin at bedtime.  3   Current Facility-Administered Medications  Medication Dose Route Frequency Provider Last Rate Last Dose  . acetaminophen (TYLENOL) tablet 975 mg  975 mg Oral Once Pearline CablesJessica C Copland, MD       REVIEW OF SYSTEMS:  A comprehensive review of systems was negative.   PHYSICAL EXAMINATION: General appearance: alert, cooperative and no distress Head: Normocephalic, without obvious abnormality, atraumatic Neck: no adenopathy, no JVD, supple, symmetrical, trachea midline and thyroid not enlarged, symmetric, no tenderness/mass/nodules Lymph nodes: Cervical, supraclavicular, and axillary nodes normal. Resp: clear to auscultation bilaterally Back: symmetric, no curvature. ROM normal. No CVA tenderness. Cardio: regular rate and rhythm, S1, S2 normal, no murmur, click, rub or gallop GI: soft, non-tender; bowel sounds normal; no masses,  no organomegaly Extremities: extremities normal, atraumatic, no cyanosis or edema  ECOG PERFORMANCE STATUS: 0 - Asymptomatic  Blood pressure 132/66, pulse 77, temperature 98.2 F (36.8 C), temperature source Oral, resp. rate 18, height 5\' 9"  (1.753 m), weight 150 lb 4.8 oz (68.176 kg), SpO2 100 %.  LABORATORY DATA: Lab Results  Component Value  Date   WBC 5.6 07/10/2015   HGB 12.1* 07/10/2015   HCT 35.7* 07/10/2015   MCV 92.0 07/10/2015   PLT 161 07/10/2015      Chemistry      Component Value Date/Time   NA 136 05/24/2014 0534   NA 139 06/05/2013 1325   K 3.9 05/24/2014 0534   K 4.1 06/05/2013 1325   CL 105 05/24/2014 0534   CO2 27 05/24/2014 0534   CO2 26 06/05/2013 1325   BUN 19  05/24/2014 0534   BUN 12.7 06/05/2013 1325   CREATININE 1.22 05/24/2014 0534   CREATININE 1.1 06/05/2013 1325      Component Value Date/Time   CALCIUM 8.5 05/24/2014 0534   CALCIUM 9.5 06/05/2013 1325   ALKPHOS 42 05/23/2014 1855   ALKPHOS 57 06/05/2013 1325   AST 50* 05/23/2014 1855   AST 20 06/05/2013 1325   ALT 23 05/23/2014 1855   ALT 11 06/05/2013 1325   BILITOT 0.9 05/23/2014 1855   BILITOT 0.69 06/05/2013 1325     Other lab results: Serum ferritin 18, serum iron 53, total iron binding capacity 309 and iron saturation 17%.  RADIOGRAPHIC STUDIES: No results found.  ASSESSMENT AND PLAN: This very pleasant 65 years old African American male with microcytic anemia secondary to iron deficiency of unclear etiology.  He is Status post Feraheme infusion several months ago and he has significant improvement in his hemoglobin and hematocrit as well as the iron study and ferritin at that time.  Unfortunately he has not been taking his oral iron tablet as order secondary to insurance changes and lack of coverage for the Integra plus. His recent CBC showed mild anemia but there was drop in his iron study and ferritin. I discussed with the patient several options including consideration of Feraheme infusion again versus treatment with over-the-counter oral iron tablets with vitamin C. The patient would like to try the over-the-counter medication for now. I would see him back for followup visit in 6 months with repeat CBC, iron study and ferritin. He was advised to call immediately if he has any concerning symptoms in the interval. The patient voices understanding of current disease status and treatment options and is in agreement with the current care plan.  All questions were answered. The patient knows to call the clinic with any problems, questions or concerns. We can certainly see the patient much sooner if necessary.  Disclaimer: This note was dictated with voice recognition software.  Similar sounding words can inadvertently be transcribed and may not be corrected upon review.

## 2015-07-15 NOTE — Patient Instructions (Signed)
Smoking Cessation, Tips for Success If you are ready to quit smoking, congratulations! You have chosen to help yourself be healthier. Cigarettes bring nicotine, tar, carbon monoxide, and other irritants into your body. Your lungs, heart, and blood vessels will be able to work better without these poisons. There are many different ways to quit smoking. Nicotine gum, nicotine patches, a nicotine inhaler, or nicotine nasal spray can help with physical craving. Hypnosis, support groups, and medicines help break the habit of smoking. WHAT THINGS CAN I DO TO MAKE QUITTING EASIER?  Here are some tips to help you quit for good:  Pick a date when you will quit smoking completely. Tell all of your friends and family about your plan to quit on that date.  Do not try to slowly cut down on the number of cigarettes you are smoking. Pick a quit date and quit smoking completely starting on that day.  Throw away all cigarettes.   Clean and remove all ashtrays from your home, work, and car.  On a card, write down your reasons for quitting. Carry the card with you and read it when you get the urge to smoke.  Cleanse your body of nicotine. Drink enough water and fluids to keep your urine clear or pale yellow. Do this after quitting to flush the nicotine from your body.  Learn to predict your moods. Do not let a bad situation be your excuse to have a cigarette. Some situations in your life might tempt you into wanting a cigarette.  Never have "just one" cigarette. It leads to wanting another and another. Remind yourself of your decision to quit.  Change habits associated with smoking. If you smoked while driving or when feeling stressed, try other activities to replace smoking. Stand up when drinking your coffee. Brush your teeth after eating. Sit in a different chair when you read the paper. Avoid alcohol while trying to quit, and try to drink fewer caffeinated beverages. Alcohol and caffeine may urge you to  smoke.  Avoid foods and drinks that can trigger a desire to smoke, such as sugary or spicy foods and alcohol.  Ask people who smoke not to smoke around you.  Have something planned to do right after eating or having a cup of coffee. For example, plan to take a walk or exercise.  Try a relaxation exercise to calm you down and decrease your stress. Remember, you may be tense and nervous for the first 2 weeks after you quit, but this will pass.  Find new activities to keep your hands busy. Play with a pen, coin, or rubber band. Doodle or draw things on paper.  Brush your teeth right after eating. This will help cut down on the craving for the taste of tobacco after meals. You can also try mouthwash.   Use oral substitutes in place of cigarettes. Try using lemon drops, carrots, cinnamon sticks, or chewing gum. Keep them handy so they are available when you have the urge to smoke.  When you have the urge to smoke, try deep breathing.  Designate your home as a nonsmoking area.  If you are a heavy smoker, ask your health care provider about a prescription for nicotine chewing gum. It can ease your withdrawal from nicotine.  Reward yourself. Set aside the cigarette money you save and buy yourself something nice.  Look for support from others. Join a support group or smoking cessation program. Ask someone at home or at work to help you with your plan   to quit smoking.  Always ask yourself, "Do I need this cigarette or is this just a reflex?" Tell yourself, "Today, I choose not to smoke," or "I do not want to smoke." You are reminding yourself of your decision to quit.  Do not replace cigarette smoking with electronic cigarettes (commonly called e-cigarettes). The safety of e-cigarettes is unknown, and some may contain harmful chemicals.  If you relapse, do not give up! Plan ahead and think about what you will do the next time you get the urge to smoke. HOW WILL I FEEL WHEN I QUIT SMOKING? You  may have symptoms of withdrawal because your body is used to nicotine (the addictive substance in cigarettes). You may crave cigarettes, be irritable, feel very hungry, cough often, get headaches, or have difficulty concentrating. The withdrawal symptoms are only temporary. They are strongest when you first quit but will go away within 10-14 days. When withdrawal symptoms occur, stay in control. Think about your reasons for quitting. Remind yourself that these are signs that your body is healing and getting used to being without cigarettes. Remember that withdrawal symptoms are easier to treat than the major diseases that smoking can cause.  Even after the withdrawal is over, expect periodic urges to smoke. However, these cravings are generally short lived and will go away whether you smoke or not. Do not smoke! WHAT RESOURCES ARE AVAILABLE TO HELP ME QUIT SMOKING? Your health care provider can direct you to community resources or hospitals for support, which may include:  Group support.  Education.  Hypnosis.  Therapy.   This information is not intended to replace advice given to you by your health care provider. Make sure you discuss any questions you have with your health care provider.   Document Released: 11/06/2003 Document Revised: 02/28/2014 Document Reviewed: 07/26/2012 Elsevier Interactive Patient Education 2016 Elsevier Inc.  

## 2015-09-04 DIAGNOSIS — L84 Corns and callosities: Secondary | ICD-10-CM | POA: Diagnosis not present

## 2015-09-04 DIAGNOSIS — E1351 Other specified diabetes mellitus with diabetic peripheral angiopathy without gangrene: Secondary | ICD-10-CM | POA: Diagnosis not present

## 2015-09-04 DIAGNOSIS — L602 Onychogryphosis: Secondary | ICD-10-CM | POA: Diagnosis not present

## 2015-09-21 DIAGNOSIS — H209 Unspecified iridocyclitis: Secondary | ICD-10-CM | POA: Diagnosis not present

## 2015-09-24 DIAGNOSIS — I10 Essential (primary) hypertension: Secondary | ICD-10-CM | POA: Diagnosis not present

## 2015-09-24 DIAGNOSIS — E08319 Diabetes mellitus due to underlying condition with unspecified diabetic retinopathy without macular edema: Secondary | ICD-10-CM | POA: Diagnosis not present

## 2015-10-15 DIAGNOSIS — E08319 Diabetes mellitus due to underlying condition with unspecified diabetic retinopathy without macular edema: Secondary | ICD-10-CM | POA: Diagnosis not present

## 2015-10-15 DIAGNOSIS — I10 Essential (primary) hypertension: Secondary | ICD-10-CM | POA: Diagnosis not present

## 2015-10-16 ENCOUNTER — Ambulatory Visit (HOSPITAL_COMMUNITY)
Admission: RE | Admit: 2015-10-16 | Discharge: 2015-10-16 | Disposition: A | Discharge status: Home / Self Care | Payer: No Typology Code available for payment source | Source: Ambulatory Visit | Attending: Vascular Surgery | Admitting: Vascular Surgery

## 2015-10-16 ENCOUNTER — Other Ambulatory Visit: Payer: Self-pay

## 2015-10-16 ENCOUNTER — Other Ambulatory Visit (HOSPITAL_COMMUNITY): Payer: Self-pay | Admitting: Family Medicine

## 2015-10-16 DIAGNOSIS — H539 Unspecified visual disturbance: Secondary | ICD-10-CM | POA: Diagnosis not present

## 2015-10-16 DIAGNOSIS — I6523 Occlusion and stenosis of bilateral carotid arteries: Secondary | ICD-10-CM | POA: Diagnosis not present

## 2015-10-16 LAB — VAS US CAROTID
LCCADDIAS: -28 cm/s
LCCADSYS: -111 cm/s
LCCAPDIAS: 28 cm/s
LEFT ECA DIAS: -20 cm/s
LEFT VERTEBRAL DIAS: 19 cm/s
LICADDIAS: -25 cm/s
LICADSYS: -79 cm/s
LICAPDIAS: -24 cm/s
LICAPSYS: -83 cm/s
Left CCA prox sys: 147 cm/s
RCCAPDIAS: 28 cm/s
RCCAPSYS: 141 cm/s
RIGHT CCA MID DIAS: 26 cm/s
RIGHT ECA DIAS: -20 cm/s
RIGHT VERTEBRAL DIAS: 18 cm/s
Right cca dist sys: -86 cm/s

## 2015-10-22 DIAGNOSIS — E08319 Diabetes mellitus due to underlying condition with unspecified diabetic retinopathy without macular edema: Secondary | ICD-10-CM | POA: Diagnosis not present

## 2015-10-22 DIAGNOSIS — I10 Essential (primary) hypertension: Secondary | ICD-10-CM | POA: Diagnosis not present

## 2015-10-22 DIAGNOSIS — E782 Mixed hyperlipidemia: Secondary | ICD-10-CM | POA: Diagnosis not present

## 2015-11-12 DIAGNOSIS — H524 Presbyopia: Secondary | ICD-10-CM | POA: Diagnosis not present

## 2015-11-12 DIAGNOSIS — H209 Unspecified iridocyclitis: Secondary | ICD-10-CM | POA: Diagnosis not present

## 2015-11-12 DIAGNOSIS — H5703 Miosis: Secondary | ICD-10-CM | POA: Diagnosis not present

## 2015-11-12 DIAGNOSIS — H04123 Dry eye syndrome of bilateral lacrimal glands: Secondary | ICD-10-CM | POA: Diagnosis not present

## 2016-01-06 ENCOUNTER — Telehealth: Payer: Self-pay | Admitting: Internal Medicine

## 2016-01-06 NOTE — Telephone Encounter (Signed)
Appointment scheduled per patient request

## 2016-01-08 ENCOUNTER — Other Ambulatory Visit: Payer: No Typology Code available for payment source

## 2016-01-15 ENCOUNTER — Other Ambulatory Visit (HOSPITAL_BASED_OUTPATIENT_CLINIC_OR_DEPARTMENT_OTHER): Payer: No Typology Code available for payment source

## 2016-01-15 DIAGNOSIS — D539 Nutritional anemia, unspecified: Secondary | ICD-10-CM

## 2016-01-15 DIAGNOSIS — D509 Iron deficiency anemia, unspecified: Secondary | ICD-10-CM | POA: Diagnosis not present

## 2016-01-15 LAB — CBC WITH DIFFERENTIAL/PLATELET
BASO%: 0.7 % (ref 0.0–2.0)
Basophils Absolute: 0 10*3/uL (ref 0.0–0.1)
EOS%: 3 % (ref 0.0–7.0)
Eosinophils Absolute: 0.2 10*3/uL (ref 0.0–0.5)
HCT: 42 % (ref 38.4–49.9)
HEMOGLOBIN: 13.9 g/dL (ref 13.0–17.1)
LYMPH%: 23.7 % (ref 14.0–49.0)
MCH: 31.3 pg (ref 27.2–33.4)
MCHC: 33 g/dL (ref 32.0–36.0)
MCV: 94.8 fL (ref 79.3–98.0)
MONO#: 0.4 10*3/uL (ref 0.1–0.9)
MONO%: 6.8 % (ref 0.0–14.0)
NEUT%: 65.8 % (ref 39.0–75.0)
NEUTROS ABS: 3.7 10*3/uL (ref 1.5–6.5)
PLATELETS: 179 10*3/uL (ref 140–400)
RBC: 4.44 10*6/uL (ref 4.20–5.82)
RDW: 15 % — ABNORMAL HIGH (ref 11.0–14.6)
WBC: 5.7 10*3/uL (ref 4.0–10.3)
lymph#: 1.3 10*3/uL (ref 0.9–3.3)

## 2016-01-15 LAB — IRON AND TIBC
%SAT: 13 % — AB (ref 20–55)
Iron: 37 ug/dL — ABNORMAL LOW (ref 42–163)
TIBC: 295 ug/dL (ref 202–409)
UIBC: 258 ug/dL (ref 117–376)

## 2016-01-15 LAB — FERRITIN: Ferritin: 30 ng/ml (ref 22–316)

## 2016-01-18 ENCOUNTER — Telehealth: Payer: Self-pay | Admitting: Internal Medicine

## 2016-01-18 ENCOUNTER — Encounter: Payer: Self-pay | Admitting: Internal Medicine

## 2016-01-18 ENCOUNTER — Ambulatory Visit (HOSPITAL_BASED_OUTPATIENT_CLINIC_OR_DEPARTMENT_OTHER): Payer: No Typology Code available for payment source | Admitting: Internal Medicine

## 2016-01-18 VITALS — BP 131/89 | HR 99 | Temp 98.1°F | Wt 148.7 lb

## 2016-01-18 DIAGNOSIS — D509 Iron deficiency anemia, unspecified: Secondary | ICD-10-CM

## 2016-01-18 DIAGNOSIS — D539 Nutritional anemia, unspecified: Secondary | ICD-10-CM

## 2016-01-18 NOTE — Progress Notes (Signed)
[facility] [phone]   Fax:([phone]  OFFICE PROGRESS NOTE  Geraldo PitterBLAND,VEITA J, MD [address] [place] [address]  DIAGNOSIS: Microcytic anemia secondary to iron deficiency  PRIOR THERAPY: Status post Feraheme infusion 510 MG IV 2 doses last dose was given on 03/28/2014  CURRENT THERAPY: Ferrous sulfate 325 mg by mouth daily.  INTERVAL HISTORY: Jeffrey Stevens 65 y.o. male returns to the clinic today for six-month followup visit accompanied by his wife. The patient is feeling fine today with no specific complaints. He has a lot of energy and was raking leaves recently. He denied having any significant fatigue or weakness. He denied having any dizzy spells. He denied having any chest pain, shortness of breath, cough or hemoptysis. He is currently on oral ferrous sulfate 325 mg by mouth daily. He has repeat CBC performed and iron study and ferritin performed recently and he is here for evaluation and discussion of his lab results.  MEDICAL HISTORY: Past Medical History:  Diagnosis Date  . Diabetes mellitus without complication (HCC)   . Dyslipidemia   . Hypertension   . Pneumonia 05/23/2014    ALLERGIES:  has No Known Allergies.  MEDICATIONS:  Current Outpatient Prescriptions  Medication Sig Dispense Refill  . ACCU-CHEK SOFTCLIX LANCETS lancets     . aspirin 81 MG tablet Take 81 mg by mouth daily.    . BD PEN NEEDLE NANO U/F 32G X 4 MM MISC See admin instructions.  4  . Bromocriptine Mesylate (CYCLOSET) 0.8 MG TABS Take 4 tablets by mouth every morning.    . ferrous sulfate 325 (65 FE) MG tablet Take 325 mg by mouth daily with breakfast.    . lisinopril-hydrochlorothiazide (PRINZIDE,ZESTORETIC) 20-12.5 MG per tablet Take 1 tablet by mouth daily. 30 tablet 0  . Multiple Vitamin (MULTIVITAMIN) tablet Take 1 tablet by mouth daily.    . ONE TOUCH ULTRA TEST test strip     . prednisoLONE acetate (PRED FORTE) 1 % ophthalmic suspension  Place 1 drop into the right eye 4 (four) times daily.  1  . Saxagliptin-Metformin (KOMBIGLYZE XR) 2.06-998 MG TB24 Take 1 tablet by mouth every morning.     . simvastatin (ZOCOR) 20 MG tablet   3  . TOUJEO SOLOSTAR 300 UNIT/ML SOPN Inject 30 Units into the skin at bedtime.  3   Current Facility-Administered Medications  Medication Dose Route Frequency Provider Last Rate Last Dose  . acetaminophen (TYLENOL) tablet 975 mg  975 mg Oral Once Pearline CablesJessica C Copland, MD       REVIEW OF SYSTEMS:  A comprehensive review of systems was negative.   PHYSICAL EXAMINATION: General appearance: alert, cooperative and no distress Head: Normocephalic, without obvious abnormality, atraumatic Neck: no adenopathy, no JVD, supple, symmetrical, trachea midline and thyroid not enlarged, symmetric, no tenderness/mass/nodules Lymph nodes: Cervical, supraclavicular, and axillary nodes normal. Resp: clear to auscultation bilaterally Back: symmetric, no curvature. ROM normal. No CVA tenderness. Cardio: regular rate and rhythm, S1, S2 normal, no murmur, click, rub or gallop GI: soft, non-tender; bowel sounds normal; no masses,  no organomegaly Extremities: extremities normal, atraumatic, no cyanosis or edema  ECOG PERFORMANCE STATUS: 0 - Asymptomatic  Blood pressure 131/89, pulse 99, temperature 98.1 F (36.7 C), temperature source Oral, weight 148 lb 11.2 oz (67.4 kg), SpO2 100 %.  LABORATORY DATA: Lab Results  Component Value Date   WBC 5.7 01/15/2016   HGB 13.9 01/15/2016   HCT 42.0 01/15/2016  MCV 94.8 01/15/2016   PLT 179 01/15/2016      Chemistry      Component Value Date/Time   NA 136 05/24/2014 0534   NA 139 06/05/2013 1325   K 3.9 05/24/2014 0534   K 4.1 06/05/2013 1325   CL 105 05/24/2014 0534   CO2 27 05/24/2014 0534   CO2 26 06/05/2013 1325   BUN 19 05/24/2014 0534   BUN 12.7 06/05/2013 1325   CREATININE 1.22 05/24/2014 0534   CREATININE 1.1 06/05/2013 1325      Component Value  Date/Time   CALCIUM 8.5 05/24/2014 0534   CALCIUM 9.5 06/05/2013 1325   ALKPHOS 42 05/23/2014 1855   ALKPHOS 57 06/05/2013 1325   AST 50 (H) 05/23/2014 1855   AST 20 06/05/2013 1325   ALT 23 05/23/2014 1855   ALT 11 06/05/2013 1325   BILITOT 0.9 05/23/2014 1855   BILITOT 0.69 06/05/2013 1325     Other lab results: Serum ferritin 18, serum iron 37, total iron binding capacity 295 and iron saturation 13%.  RADIOGRAPHIC STUDIES: No results found.  ASSESSMENT AND PLAN: This very pleasant 65 years old African American male with microcytic anemia secondary to iron deficiency of unclear etiology.  He is Status post Feraheme infusion several months ago and he has significant improvement in his hemoglobin and hematocrit as well as the iron study and ferritin at that time.  He is currently on oral iron tablets ferrous sulfate 325 mg by mouth daily. He is tolerating the treatment well. His CBC today showed normal hemoglobin and hematocrit. Iron study showed low serum iron and iron saturation but ferritin level is normal. I discussed the lab result with the patient today. I recommended for him to continue on the oral iron tablets for now. I would see him back for followup visit in 6 months with repeat CBC, iron study and ferritin. He was advised to call immediately if he has any concerning symptoms in the interval. The patient voices understanding of current disease status and treatment options and is in agreement with the current care plan.  All questions were answered. The patient knows to call the clinic with any problems, questions or concerns. We can certainly see the patient much sooner if necessary.  Disclaimer: This note was dictated with voice recognition software. Similar sounding words can inadvertently be transcribed and may not be corrected upon review.

## 2016-01-18 NOTE — Telephone Encounter (Signed)
Appointments scheduled per 11/27 LOS. Patient given AVS report and calendars with future scheduled appointments. °

## 2016-01-21 DIAGNOSIS — E08319 Diabetes mellitus due to underlying condition with unspecified diabetic retinopathy without macular edema: Secondary | ICD-10-CM | POA: Diagnosis not present

## 2016-01-21 DIAGNOSIS — Z23 Encounter for immunization: Secondary | ICD-10-CM | POA: Diagnosis not present

## 2016-01-21 DIAGNOSIS — E782 Mixed hyperlipidemia: Secondary | ICD-10-CM | POA: Diagnosis not present

## 2016-01-21 DIAGNOSIS — I10 Essential (primary) hypertension: Secondary | ICD-10-CM | POA: Diagnosis not present

## 2016-04-15 DIAGNOSIS — L84 Corns and callosities: Secondary | ICD-10-CM | POA: Diagnosis not present

## 2016-04-15 DIAGNOSIS — E1351 Other specified diabetes mellitus with diabetic peripheral angiopathy without gangrene: Secondary | ICD-10-CM | POA: Diagnosis not present

## 2016-04-15 DIAGNOSIS — L602 Onychogryphosis: Secondary | ICD-10-CM | POA: Diagnosis not present

## 2016-07-15 ENCOUNTER — Other Ambulatory Visit (HOSPITAL_BASED_OUTPATIENT_CLINIC_OR_DEPARTMENT_OTHER): Payer: No Typology Code available for payment source

## 2016-07-15 DIAGNOSIS — D509 Iron deficiency anemia, unspecified: Secondary | ICD-10-CM | POA: Diagnosis not present

## 2016-07-15 DIAGNOSIS — D539 Nutritional anemia, unspecified: Secondary | ICD-10-CM

## 2016-07-15 LAB — CBC WITH DIFFERENTIAL/PLATELET
BASO%: 0.4 % (ref 0.0–2.0)
BASOS ABS: 0 10*3/uL (ref 0.0–0.1)
EOS%: 2.4 % (ref 0.0–7.0)
Eosinophils Absolute: 0.1 10*3/uL (ref 0.0–0.5)
HEMATOCRIT: 39.1 % (ref 38.4–49.9)
HEMOGLOBIN: 13.2 g/dL (ref 13.0–17.1)
LYMPH#: 1.7 10*3/uL (ref 0.9–3.3)
LYMPH%: 34.8 % (ref 14.0–49.0)
MCH: 31.7 pg (ref 27.2–33.4)
MCHC: 33.8 g/dL (ref 32.0–36.0)
MCV: 93.8 fL (ref 79.3–98.0)
MONO#: 0.5 10*3/uL (ref 0.1–0.9)
MONO%: 10 % (ref 0.0–14.0)
NEUT#: 2.6 10*3/uL (ref 1.5–6.5)
NEUT%: 52.4 % (ref 39.0–75.0)
PLATELETS: 155 10*3/uL (ref 140–400)
RBC: 4.17 10*6/uL — ABNORMAL LOW (ref 4.20–5.82)
RDW: 14.9 % — AB (ref 11.0–14.6)
WBC: 4.9 10*3/uL (ref 4.0–10.3)

## 2016-07-19 LAB — IRON AND TIBC
%SAT: 27 % (ref 20–55)
Iron: 80 ug/dL (ref 42–163)
TIBC: 295 ug/dL (ref 202–409)
UIBC: 215 ug/dL (ref 117–376)

## 2016-07-19 LAB — FERRITIN: Ferritin: 32 ng/ml (ref 22–316)

## 2016-07-20 ENCOUNTER — Encounter: Payer: Self-pay | Admitting: Internal Medicine

## 2016-07-20 ENCOUNTER — Telehealth: Payer: Self-pay | Admitting: Internal Medicine

## 2016-07-20 ENCOUNTER — Ambulatory Visit (HOSPITAL_BASED_OUTPATIENT_CLINIC_OR_DEPARTMENT_OTHER): Payer: No Typology Code available for payment source | Admitting: Internal Medicine

## 2016-07-20 VITALS — BP 104/68 | HR 85 | Temp 98.3°F | Resp 18 | Ht 69.0 in | Wt 150.8 lb

## 2016-07-20 DIAGNOSIS — D539 Nutritional anemia, unspecified: Secondary | ICD-10-CM

## 2016-07-20 DIAGNOSIS — D509 Iron deficiency anemia, unspecified: Secondary | ICD-10-CM | POA: Diagnosis not present

## 2016-07-20 NOTE — Progress Notes (Signed)
[facility] [phone]   Fax:([phone]  OFFICE PROGRESS NOTE  Renaye RakersBland, Veita, MD [address] [place] [address]  DIAGNOSIS: Microcytic anemia secondary to iron deficiency  PRIOR THERAPY: Status post Feraheme infusion 510 MG IV 2 doses last dose was given on 03/28/2014  CURRENT THERAPY: Ferrous sulfate 325 mg twice by mouth daily.  INTERVAL HISTORY: Jeffrey Stevens 66 y.o. male returns to the clinic today for follow-up visit accompanied by his wife. The patient is feeling fine today with no specific complaints. He denied having any fatigue or weakness. He denied having any chest pain, shortness of breath, cough or hemoptysis. He has no fever or chills. He has no nausea, vomiting, diarrhea or constipation. He is currently on oral iron tablets and tolerating it well. He had repeat CBC, iron study and ferritin and he is here for evaluation and discussion of his lab results.  MEDICAL HISTORY: Past Medical History:  Diagnosis Date  . Diabetes mellitus without complication (HCC)   . Dyslipidemia   . Hypertension   . Pneumonia 05/23/2014    ALLERGIES:  has No Known Allergies.  MEDICATIONS:  Current Outpatient Prescriptions  Medication Sig Dispense Refill  . ACCU-CHEK SOFTCLIX LANCETS lancets     . BD PEN NEEDLE NANO U/F 32G X 4 MM MISC See admin instructions.  4  . Bromocriptine Mesylate (CYCLOSET) 0.8 MG TABS Take 4 tablets by mouth every morning.    . ferrous sulfate 325 (65 FE) MG tablet Take 325 mg by mouth daily with breakfast.    . Insulin Glargine (BASAGLAR KWIKPEN) 100 UNIT/ML SOPN Inject 50 Units into the skin daily.  4  . lisinopril-hydrochlorothiazide (PRINZIDE,ZESTORETIC) 20-12.5 MG per tablet Take 1 tablet by mouth daily. 30 tablet 0  . Multiple Vitamin (MULTIVITAMIN) tablet Take 1 tablet by mouth daily.    . ONE TOUCH ULTRA TEST test strip     . Saxagliptin-Metformin (KOMBIGLYZE XR) 2.06-998 MG TB24 Take 1 tablet by mouth  every morning.     . simvastatin (ZOCOR) 20 MG tablet   3   Current Facility-Administered Medications  Medication Dose Route Frequency Provider Last Rate Last Dose  . acetaminophen (TYLENOL) tablet 975 mg  975 mg Oral Once Copland, Gwenlyn FoundJessica C, MD       REVIEW OF SYSTEMS:  A comprehensive review of systems was negative.   PHYSICAL EXAMINATION: General appearance: alert, cooperative and no distress Head: Normocephalic, without obvious abnormality, atraumatic Neck: no adenopathy, no JVD, supple, symmetrical, trachea midline and thyroid not enlarged, symmetric, no tenderness/mass/nodules Lymph nodes: Cervical, supraclavicular, and axillary nodes normal. Resp: clear to auscultation bilaterally Back: symmetric, no curvature. ROM normal. No CVA tenderness. Cardio: regular rate and rhythm, S1, S2 normal, no murmur, click, rub or gallop GI: soft, non-tender; bowel sounds normal; no masses,  no organomegaly Extremities: extremities normal, atraumatic, no cyanosis or edema  ECOG PERFORMANCE STATUS: 0 - Asymptomatic  Blood pressure 104/68, pulse 85, temperature 98.3 F (36.8 C), temperature source Oral, resp. rate 18, height 5\' 9"  (1.753 m), weight 150 lb 12.8 oz (68.4 kg), SpO2 99 %.  LABORATORY DATA: Lab Results  Component Value Date   WBC 4.9 07/15/2016   HGB 13.2 07/15/2016   HCT 39.1 07/15/2016   MCV 93.8 07/15/2016   PLT 155 07/15/2016      Chemistry      Component Value Date/Time   NA 136 05/24/2014 0534   NA 139 06/05/2013 1325  K 3.9 05/24/2014 0534   K 4.1 06/05/2013 1325   CL 105 05/24/2014 0534   CO2 27 05/24/2014 0534   CO2 26 06/05/2013 1325   BUN 19 05/24/2014 0534   BUN 12.7 06/05/2013 1325   CREATININE 1.22 05/24/2014 0534   CREATININE 1.1 06/05/2013 1325      Component Value Date/Time   CALCIUM 8.5 05/24/2014 0534   CALCIUM 9.5 06/05/2013 1325   ALKPHOS 42 05/23/2014 1855   ALKPHOS 57 06/05/2013 1325   AST 50 (H) 05/23/2014 1855   AST 20 06/05/2013 1325    ALT 23 05/23/2014 1855   ALT 11 06/05/2013 1325   BILITOT 0.9 05/23/2014 1855   BILITOT 0.69 06/05/2013 1325     Other lab results: Serum ferritin 32, serum iron 80, total iron binding capacity 295 and iron saturation 27%.  RADIOGRAPHIC STUDIES: No results found.  ASSESSMENT AND PLAN:  This is a very pleasant 66 years old African-American male with microcytic anemia secondary to iron deficiency. He is status post treatment with Feraheme infusion and currently on oral iron tablets with ferrous sulfate 325 mg by mouth twice a day. He is tolerating the treatment well. His CBC and iron study showed improvement of his serum iron and iron saturation. His hemoglobin and hematocrit are within the normal range. I recommended for the patient to continue with the oral iron tablets for now. I will see him back for follow-up visit in 3 months for evaluation with repeat CBC, iron study and ferritin. He was advised to call immediately if he has any concerning symptoms in the interval. The patient voices understanding of current disease status and treatment options and is in agreement with the current care plan. All questions were answered. The patient knows to call the clinic with any problems, questions or concerns. We can certainly see the patient much sooner if necessary. I spent 10 minutes counseling the patient face to face. The total time spent in the appointment was 15 minutes. Disclaimer: This note was dictated with voice recognition software. Similar sounding words can inadvertently be transcribed and may not be corrected upon review.

## 2016-07-20 NOTE — Telephone Encounter (Signed)
Appointments scheduled per 07/20/16 los. Patient was given a copy of the AVS report and appointment schedule, per 07/20/16 los. °

## 2016-07-20 NOTE — Patient Instructions (Signed)
Steps to Quit Smoking Smoking tobacco can be bad for your health. It can also affect almost every organ in your body. Smoking puts you and people around you at risk for many serious long-lasting (chronic) diseases. Quitting smoking is hard, but it is one of the best things that you can do for your health. It is never too late to quit. What are the benefits of quitting smoking? When you quit smoking, you lower your risk for getting serious diseases and conditions. They can include:  Lung cancer or lung disease.  Heart disease.  Stroke.  Heart attack.  Not being able to have children (infertility).  Weak bones (osteoporosis) and broken bones (fractures). If you have coughing, wheezing, and shortness of breath, those symptoms may get better when you quit. You may also get sick less often. If you are pregnant, quitting smoking can help to lower your chances of having a baby of low birth weight. What can I do to help me quit smoking? Talk with your doctor about what can help you quit smoking. Some things you can do (strategies) include:  Quitting smoking totally, instead of slowly cutting back how much you smoke over a period of time.  Going to in-person counseling. You are more likely to quit if you go to many counseling sessions.  Using resources and support systems, such as:  Online chats with a counselor.  Phone quitlines.  Printed self-help materials.  Support groups or group counseling.  Text messaging programs.  Mobile phone apps or applications.  Taking medicines. Some of these medicines may have nicotine in them. If you are pregnant or breastfeeding, do not take any medicines to quit smoking unless your doctor says it is okay. Talk with your doctor about counseling or other things that can help you. Talk with your doctor about using more than one strategy at the same time, such as taking medicines while you are also going to in-person counseling. This can help make quitting  easier. What things can I do to make it easier to quit? Quitting smoking might feel very hard at first, but there is a lot that you can do to make it easier. Take these steps:  Talk to your family and friends. Ask them to support and encourage you.  Call phone quitlines, reach out to support groups, or work with a counselor.  Ask people who smoke to not smoke around you.  Avoid places that make you want (trigger) to smoke, such as:  Bars.  Parties.  Smoke-break areas at work.  Spend time with people who do not smoke.  Lower the stress in your life. Stress can make you want to smoke. Try these things to help your stress:  Getting regular exercise.  Deep-breathing exercises.  Yoga.  Meditating.  Doing a body scan. To do this, close your eyes, focus on one area of your body at a time from head to toe, and notice which parts of your body are tense. Try to relax the muscles in those areas.  Download or buy apps on your mobile phone or tablet that can help you stick to your quit plan. There are many free apps, such as QuitGuide from the CDC (Centers for Disease Control and Prevention). You can find more support from smokefree.gov and other websites. This information is not intended to replace advice given to you by your health care provider. Make sure you discuss any questions you have with your health care provider. Document Released: 12/04/2008 Document Revised: 10/06/2015 Document   Reviewed: 06/24/2014 Elsevier Interactive Patient Education  2017 Elsevier Inc.  

## 2016-08-22 DIAGNOSIS — L84 Corns and callosities: Secondary | ICD-10-CM | POA: Diagnosis not present

## 2016-08-22 DIAGNOSIS — E1351 Other specified diabetes mellitus with diabetic peripheral angiopathy without gangrene: Secondary | ICD-10-CM | POA: Diagnosis not present

## 2016-10-21 ENCOUNTER — Other Ambulatory Visit: Payer: No Typology Code available for payment source

## 2016-10-25 DIAGNOSIS — E782 Mixed hyperlipidemia: Secondary | ICD-10-CM | POA: Diagnosis not present

## 2016-10-25 DIAGNOSIS — E119 Type 2 diabetes mellitus without complications: Secondary | ICD-10-CM | POA: Diagnosis not present

## 2016-10-25 DIAGNOSIS — D649 Anemia, unspecified: Secondary | ICD-10-CM | POA: Diagnosis not present

## 2016-10-25 DIAGNOSIS — I10 Essential (primary) hypertension: Secondary | ICD-10-CM | POA: Diagnosis not present

## 2016-10-26 ENCOUNTER — Ambulatory Visit: Payer: No Typology Code available for payment source | Admitting: Internal Medicine

## 2016-12-09 DIAGNOSIS — L602 Onychogryphosis: Secondary | ICD-10-CM | POA: Diagnosis not present

## 2016-12-09 DIAGNOSIS — L84 Corns and callosities: Secondary | ICD-10-CM | POA: Diagnosis not present

## 2016-12-09 DIAGNOSIS — E1351 Other specified diabetes mellitus with diabetic peripheral angiopathy without gangrene: Secondary | ICD-10-CM | POA: Diagnosis not present

## 2016-12-12 DIAGNOSIS — Z23 Encounter for immunization: Secondary | ICD-10-CM | POA: Diagnosis not present

## 2016-12-16 ENCOUNTER — Other Ambulatory Visit (HOSPITAL_BASED_OUTPATIENT_CLINIC_OR_DEPARTMENT_OTHER): Payer: No Typology Code available for payment source

## 2016-12-16 DIAGNOSIS — D509 Iron deficiency anemia, unspecified: Secondary | ICD-10-CM | POA: Diagnosis not present

## 2016-12-16 DIAGNOSIS — D539 Nutritional anemia, unspecified: Secondary | ICD-10-CM

## 2016-12-16 LAB — CBC WITH DIFFERENTIAL/PLATELET
BASO%: 1.4 % (ref 0.0–2.0)
Basophils Absolute: 0.1 10*3/uL (ref 0.0–0.1)
EOS ABS: 0.3 10*3/uL (ref 0.0–0.5)
EOS%: 5.7 % (ref 0.0–7.0)
HEMATOCRIT: 40.2 % (ref 38.4–49.9)
HEMOGLOBIN: 13.4 g/dL (ref 13.0–17.1)
LYMPH#: 1.4 10*3/uL (ref 0.9–3.3)
LYMPH%: 28.8 % (ref 14.0–49.0)
MCH: 31.7 pg (ref 27.2–33.4)
MCHC: 33.4 g/dL (ref 32.0–36.0)
MCV: 95 fL (ref 79.3–98.0)
MONO#: 0.5 10*3/uL (ref 0.1–0.9)
MONO%: 10.3 % (ref 0.0–14.0)
NEUT%: 53.8 % (ref 39.0–75.0)
NEUTROS ABS: 2.7 10*3/uL (ref 1.5–6.5)
Platelets: 174 10*3/uL (ref 140–400)
RBC: 4.23 10*6/uL (ref 4.20–5.82)
RDW: 15.1 % — AB (ref 11.0–14.6)
WBC: 4.9 10*3/uL (ref 4.0–10.3)

## 2016-12-16 LAB — FERRITIN: FERRITIN: 35 ng/mL (ref 22–316)

## 2016-12-16 LAB — IRON AND TIBC
%SAT: 21 % (ref 20–55)
IRON: 60 ug/dL (ref 42–163)
TIBC: 290 ug/dL (ref 202–409)
UIBC: 230 ug/dL (ref 117–376)

## 2016-12-26 ENCOUNTER — Telehealth: Payer: Self-pay | Admitting: Internal Medicine

## 2016-12-26 ENCOUNTER — Ambulatory Visit (HOSPITAL_BASED_OUTPATIENT_CLINIC_OR_DEPARTMENT_OTHER): Payer: No Typology Code available for payment source | Admitting: Internal Medicine

## 2016-12-26 ENCOUNTER — Encounter: Payer: Self-pay | Admitting: Internal Medicine

## 2016-12-26 VITALS — BP 131/90 | HR 80 | Temp 98.3°F | Resp 18 | Ht 69.0 in | Wt 150.7 lb

## 2016-12-26 DIAGNOSIS — D509 Iron deficiency anemia, unspecified: Secondary | ICD-10-CM

## 2016-12-26 DIAGNOSIS — D500 Iron deficiency anemia secondary to blood loss (chronic): Secondary | ICD-10-CM

## 2016-12-26 NOTE — Telephone Encounter (Signed)
Gave avs and calendar for May 2019 °

## 2016-12-26 NOTE — Progress Notes (Signed)
[facility] Telephone:([phone]) [phone]   Fax:(336) [phone]  OFFICE PROGRESS NOTE  Renaye RakersBland, Veita, MD [address] [place] [id number]  DIAGNOSIS: Microcytic anemia secondary to iron deficiency  PRIOR THERAPY: Status post Feraheme infusion 510 MG IV 2 doses last dose was given on 03/28/2014  CURRENT THERAPY: Ferrous sulfate 325 mg twice by mouth daily.  INTERVAL HISTORY: Jeffrey Stevens 66 y.o. male returns to the clinic today for follow-up visit accompanied by his wife.  The patient is feeling fine today with no specific complaints.  He denied having any significant fatigue or weakness.  He denied having any chest pain, shortness of breath, cough or hemoptysis.  He has no nausea, vomiting but they were concerned.  He continues to tolerate his with oral ferrous sulfate twice daily fairly well.  He had repeat CT, iron and ferritin recently and he is here for evaluation and discussion of his lab results.  MEDICAL HISTORY: Past Medical History:  Diagnosis Date  . Diabetes mellitus without complication (HCC)   . Dyslipidemia   . Hypertension   . Pneumonia 05/23/2014    ALLERGIES:  has No Known Allergies.  MEDICATIONS:  Current Outpatient Medications  Medication Sig Dispense Refill  . ACCU-CHEK SOFTCLIX LANCETS lancets     . BD PEN NEEDLE NANO U/F 32G X 4 MM MISC See admin instructions.  4  . Bromocriptine Mesylate (CYCLOSET) 0.8 MG TABS Take 4 tablets by mouth every morning.    . ferrous sulfate 325 (65 FE) MG tablet Take 325 mg by mouth daily with breakfast.    . Insulin Glargine (BASAGLAR KWIKPEN) 100 UNIT/ML SOPN Inject 50 Units into the skin daily.  4  . lisinopril-hydrochlorothiazide (PRINZIDE,ZESTORETIC) 20-12.5 MG per tablet Take 1 tablet by mouth daily. 30 tablet 0  . Multiple Vitamin (MULTIVITAMIN) tablet Take 1 tablet by mouth daily.    . ONE TOUCH ULTRA TEST test strip     . Saxagliptin-Metformin (KOMBIGLYZE XR) 2.06-998 MG TB24 Take 1 tablet by  mouth every morning.     . simvastatin (ZOCOR) 20 MG tablet   3   Current Facility-Administered Medications  Medication Dose Route Frequency Provider Last Rate Last Dose  . acetaminophen (TYLENOL) tablet 975 mg  975 mg Oral Once Copland, Gwenlyn FoundJessica C, MD       REVIEW OF SYSTEMS:  A comprehensive review of systems was negative.   PHYSICAL EXAMINATION: General appearance: alert, cooperative and no distress Head: Normocephalic, without obvious abnormality, atraumatic Neck: no adenopathy, no JVD, supple, symmetrical, trachea midline and thyroid not enlarged, symmetric, no tenderness/mass/nodules Lymph nodes: Cervical, supraclavicular, and axillary nodes normal. Resp: clear to auscultation bilaterally Back: symmetric, no curvature. ROM normal. No CVA tenderness. Cardio: regular rate and rhythm, S1, S2 normal, no murmur, click, rub or gallop GI: soft, non-tender; bowel sounds normal; no masses,  no organomegaly Extremities: extremities normal, atraumatic, no cyanosis or edema  ECOG PERFORMANCE STATUS: 0 - Asymptomatic  Blood pressure 131/90, pulse 80, temperature 98.3 F (36.8 C), temperature source Oral, resp. rate 18, height 5\' 9"  (1.753 m), weight 150 lb 11.2 oz (68.4 kg), SpO2 100 %.  LABORATORY DATA: Lab Results  Component Value Date   WBC 4.9 12/16/2016   HGB 13.4 12/16/2016   HCT 40.2 12/16/2016   MCV 95.0 12/16/2016   PLT 174 12/16/2016      Chemistry      Component Value Date/Time   NA 136 05/24/2014 0534   NA 139  06/05/2013 1325   K 3.9 05/24/2014 0534   K 4.1 06/05/2013 1325   CL 105 05/24/2014 0534   CO2 27 05/24/2014 0534   CO2 26 06/05/2013 1325   BUN 19 05/24/2014 0534   BUN 12.7 06/05/2013 1325   CREATININE 1.22 05/24/2014 0534   CREATININE 1.1 06/05/2013 1325      Component Value Date/Time   CALCIUM 8.5 05/24/2014 0534   CALCIUM 9.5 06/05/2013 1325   ALKPHOS 42 05/23/2014 1855   ALKPHOS 57 06/05/2013 1325   AST 50 (H) 05/23/2014 1855   AST 20 06/05/2013  1325   ALT 23 05/23/2014 1855   ALT 11 06/05/2013 1325   BILITOT 0.9 05/23/2014 1855   BILITOT 0.69 06/05/2013 1325     Other lab results: Serum ferritin 35, serum iron 60, total iron binding capacity 230 and iron saturation 21%.  RADIOGRAPHIC STUDIES: No results found.  ASSESSMENT AND PLAN:  This is a very pleasant 66 years old African-American male with microcytic anemia secondary to iron deficiency. He is status post treatment with Feraheme infusion and currently on oral iron tablets with ferrous sulfate 325 mg by mouth twice a day. The patient continues to tolerate the oral iron tablets fairly well. His hemoglobin, hematocrit, iron studies and ferritin are within the normal range. I recommended for the patient to continue with the current oral iron tablets for now. I will see him back for follow-up visit in 6 months for evaluation with repeat CBC, iron studies and ferritin. He was advised to call immediately if he has any concerning symptoms in the interval. The patient voices understanding of current disease status and treatment options and is in agreement with the current care plan. All questions were answered. The patient knows to call the clinic with any problems, questions or concerns. We can certainly see the patient much sooner if necessary. I spent 10 minutes counseling the patient face to face. The total time spent in the appointment was 15 minutes. Disclaimer: This note was dictated with voice recognition software. Similar sounding words can inadvertently be transcribed and may not be corrected upon review.

## 2017-02-24 DIAGNOSIS — E1351 Other specified diabetes mellitus with diabetic peripheral angiopathy without gangrene: Secondary | ICD-10-CM | POA: Diagnosis not present

## 2017-02-24 DIAGNOSIS — L602 Onychogryphosis: Secondary | ICD-10-CM | POA: Diagnosis not present

## 2017-02-24 DIAGNOSIS — L84 Corns and callosities: Secondary | ICD-10-CM | POA: Diagnosis not present

## 2017-02-27 DIAGNOSIS — I10 Essential (primary) hypertension: Secondary | ICD-10-CM | POA: Diagnosis not present

## 2017-02-27 DIAGNOSIS — E782 Mixed hyperlipidemia: Secondary | ICD-10-CM | POA: Diagnosis not present

## 2017-02-27 DIAGNOSIS — D649 Anemia, unspecified: Secondary | ICD-10-CM | POA: Diagnosis not present

## 2017-02-27 DIAGNOSIS — E119 Type 2 diabetes mellitus without complications: Secondary | ICD-10-CM | POA: Diagnosis not present

## 2017-05-25 DIAGNOSIS — E1351 Other specified diabetes mellitus with diabetic peripheral angiopathy without gangrene: Secondary | ICD-10-CM | POA: Diagnosis not present

## 2017-05-25 DIAGNOSIS — L602 Onychogryphosis: Secondary | ICD-10-CM | POA: Diagnosis not present

## 2017-05-25 DIAGNOSIS — L84 Corns and callosities: Secondary | ICD-10-CM | POA: Diagnosis not present

## 2017-06-23 ENCOUNTER — Inpatient Hospital Stay: Payer: No Typology Code available for payment source | Attending: Internal Medicine

## 2017-06-23 DIAGNOSIS — D509 Iron deficiency anemia, unspecified: Secondary | ICD-10-CM | POA: Diagnosis not present

## 2017-06-23 DIAGNOSIS — D500 Iron deficiency anemia secondary to blood loss (chronic): Secondary | ICD-10-CM

## 2017-06-23 LAB — CBC WITH DIFFERENTIAL/PLATELET
BASOS ABS: 0 10*3/uL (ref 0.0–0.1)
BASOS PCT: 1 %
Eosinophils Absolute: 0.2 10*3/uL (ref 0.0–0.5)
Eosinophils Relative: 4 %
HEMATOCRIT: 39.7 % (ref 38.4–49.9)
Hemoglobin: 13.4 g/dL (ref 13.0–17.1)
LYMPHS PCT: 34 %
Lymphs Abs: 2.1 10*3/uL (ref 0.9–3.3)
MCH: 31.8 pg (ref 27.2–33.4)
MCHC: 33.8 g/dL (ref 32.0–36.0)
MCV: 94.3 fL (ref 79.3–98.0)
Monocytes Absolute: 0.5 10*3/uL (ref 0.1–0.9)
Monocytes Relative: 7 %
NEUTROS ABS: 3.3 10*3/uL (ref 1.5–6.5)
Neutrophils Relative %: 54 %
Platelets: 158 10*3/uL (ref 140–400)
RBC: 4.21 MIL/uL (ref 4.20–5.82)
RDW: 14.8 % — ABNORMAL HIGH (ref 11.0–14.6)
WBC: 6.1 10*3/uL (ref 4.0–10.3)

## 2017-06-23 LAB — IRON AND TIBC
Iron: 50 ug/dL (ref 42–163)
Saturation Ratios: 17 % — ABNORMAL LOW (ref 42–163)
TIBC: 296 ug/dL (ref 202–409)
UIBC: 246 ug/dL

## 2017-06-23 LAB — FERRITIN: FERRITIN: 38 ng/mL (ref 22–316)

## 2017-06-27 ENCOUNTER — Inpatient Hospital Stay (HOSPITAL_BASED_OUTPATIENT_CLINIC_OR_DEPARTMENT_OTHER): Payer: No Typology Code available for payment source | Admitting: Internal Medicine

## 2017-06-27 ENCOUNTER — Encounter: Payer: Self-pay | Admitting: Internal Medicine

## 2017-06-27 VITALS — BP 110/71 | HR 96 | Temp 98.3°F | Resp 18 | Ht 69.0 in | Wt 147.6 lb

## 2017-06-27 DIAGNOSIS — D509 Iron deficiency anemia, unspecified: Secondary | ICD-10-CM

## 2017-06-27 DIAGNOSIS — E119 Type 2 diabetes mellitus without complications: Secondary | ICD-10-CM | POA: Diagnosis not present

## 2017-06-27 DIAGNOSIS — I10 Essential (primary) hypertension: Secondary | ICD-10-CM | POA: Diagnosis not present

## 2017-06-27 DIAGNOSIS — D539 Nutritional anemia, unspecified: Secondary | ICD-10-CM

## 2017-06-27 NOTE — Progress Notes (Signed)
[facility] Telephone:(336) [phone]   Fax:(336) [phone]  OFFICE PROGRESS NOTE  Renaye Rakers, MD [address] [place] [address]  DIAGNOSIS: Microcytic anemia secondary to iron deficiency  PRIOR THERAPY: Status post Feraheme infusion 510 MG IV 2 doses last dose was given on 03/28/2014  CURRENT THERAPY: Ferrous sulfate 325 mg twice by mouth daily.  INTERVAL HISTORY: Jeffrey Stevens 67 y.o. male returns to the clinic today for six-month follow-up visit accompanied by his wife.  The patient is feeling fine today with no specific complaints.  He denied having any fatigue.  He has no shortness of breath or dizzy spells.  He denied having any chest pain, cough or hemoptysis.  He has no recent weight loss or night sweats.  He is currently on oral iron tablets with ferrous sulfate 325 mg p.o. twice daily.  He has no gastrointestinal issues with his iron supplement.  He had repeat CBC, iron study and ferritin performed recently and he is here for evaluation and discussion of his lab results.  MEDICAL HISTORY: Past Medical History:  Diagnosis Date  . Diabetes mellitus without complication (HCC)   . Dyslipidemia   . Hypertension   . Pneumonia 05/23/2014    ALLERGIES:  has No Known Allergies.  MEDICATIONS:  Current Outpatient Medications  Medication Sig Dispense Refill  . ACCU-CHEK SOFTCLIX LANCETS lancets     . Bromocriptine Mesylate (CYCLOSET) 0.8 MG TABS Take 4 tablets by mouth every morning.    . ferrous sulfate 325 (65 FE) MG tablet Take 325 mg by mouth daily with breakfast.    . Insulin Glargine (BASAGLAR KWIKPEN) 100 UNIT/ML SOPN Inject 50 Units into the skin daily.  4  . lisinopril-hydrochlorothiazide (PRINZIDE,ZESTORETIC) 20-12.5 MG per tablet Take 1 tablet by mouth daily. 30 tablet 0  . Multiple Vitamin (MULTIVITAMIN) tablet Take 1 tablet by mouth daily.    . ONE TOUCH ULTRA TEST test strip     . rosuvastatin (CRESTOR) 10 MG tablet Take 10 mg by  mouth daily.  1  . Saxagliptin-Metformin (KOMBIGLYZE XR) 2.06-998 MG TB24 Take 1 tablet by mouth every morning.     . simvastatin (ZOCOR) 20 MG tablet   3  . VENTOLIN HFA 108 (90 Base) MCG/ACT inhaler INHALE 2 PUFFS 4 TIMES A DAY  2  . BD PEN NEEDLE NANO U/F 32G X 4 MM MISC See admin instructions.  4   Current Facility-Administered Medications  Medication Dose Route Frequency Provider Last Rate Last Dose  . acetaminophen (TYLENOL) tablet 975 mg  975 mg Oral Once Copland, Gwenlyn Found, MD       REVIEW OF SYSTEMS:  A comprehensive review of systems was negative.   PHYSICAL EXAMINATION: General appearance: alert, cooperative and no distress Head: Normocephalic, without obvious abnormality, atraumatic Neck: no adenopathy, no JVD, supple, symmetrical, trachea midline and thyroid not enlarged, symmetric, no tenderness/mass/nodules Lymph nodes: Cervical, supraclavicular, and axillary nodes normal. Resp: clear to auscultation bilaterally Back: symmetric, no curvature. ROM normal. No CVA tenderness. Cardio: regular rate and rhythm, S1, S2 normal, no murmur, click, rub or gallop GI: soft, non-tender; bowel sounds normal; no masses,  no organomegaly Extremities: extremities normal, atraumatic, no cyanosis or edema  ECOG PERFORMANCE STATUS: 0 - Asymptomatic  Blood pressure 110/71, pulse 96, temperature 98.3 F (36.8 C), temperature source Oral, resp. rate 18, height  (1.753 m), weight 147 lb 9.6 oz (67 kg), SpO2 98 %.  LABORATORY DATA: Lab Results  Component Value Date   WBC 6.1 06/23/2017   HGB 13.4 06/23/2017   HCT 39.7 06/23/2017   MCV 94.3 06/23/2017   PLT 158 06/23/2017      Chemistry      Component Value Date/Time   NA 136 05/24/2014 0534   NA 139 06/05/2013 1325   K 3.9 05/24/2014 0534   K 4.1 06/05/2013 1325   CL 105 05/24/2014 0534   CO2 27 05/24/2014 0534   CO2 26 06/05/2013 1325   BUN 19 05/24/2014 0534   BUN 12.7 06/05/2013 1325   CREATININE 1.22 05/24/2014 0534    CREATININE 1.1 06/05/2013 1325      Component Value Date/Time   CALCIUM 8.5 05/24/2014 0534   CALCIUM 9.5 06/05/2013 1325   ALKPHOS 42 05/23/2014 1855   ALKPHOS 57 06/05/2013 1325   AST 50 (H) 05/23/2014 1855   AST 20 06/05/2013 1325   ALT 23 05/23/2014 1855   ALT 11 06/05/2013 1325   BILITOT 0.9 05/23/2014 1855   BILITOT 0.69 06/05/2013 1325     Other lab results: Serum ferritin 38, serum iron 50, total iron binding capacity 296 and iron saturation 17%.  RADIOGRAPHIC STUDIES: No results found.  ASSESSMENT AND PLAN:  This is a very pleasant 67 years old African-American male with microcytic anemia secondary to iron deficiency. He is status post treatment with Feraheme infusion and currently on oral iron tablets with ferrous sulfate 325 mg by mouth twice a day. He is tolerating his treatment well. The repeat CBC, iron study and ferritin are unremarkable for any anemia. I discussed the lab results with the patient and his wife and recommended for him to continue on observation with routine follow-up visit by his primary care physician at this point.  I will be happy to see him in the future if needed.  The patient voices understanding of current disease status and treatment options and is in agreement with the current care plan. All questions were answered. The patient knows to call the clinic with any problems, questions or concerns. We can certainly see the patient much sooner if necessary. I spent 10 minutes counseling the patient face to face. The total time spent in the appointment was 15 minutes. Disclaimer: This note was dictated with voice recognition software. Similar sounding words can inadvertently be transcribed and may not be corrected upon review.

## 2017-07-14 ENCOUNTER — Other Ambulatory Visit: Payer: Self-pay | Admitting: Family Medicine

## 2017-07-14 DIAGNOSIS — F17200 Nicotine dependence, unspecified, uncomplicated: Secondary | ICD-10-CM

## 2017-07-21 ENCOUNTER — Ambulatory Visit
Admission: RE | Admit: 2017-07-21 | Discharge: 2017-07-21 | Disposition: A | Discharge status: Home / Self Care | Payer: No Typology Code available for payment source | Source: Ambulatory Visit | Attending: Family Medicine | Admitting: Family Medicine

## 2017-07-21 DIAGNOSIS — F17200 Nicotine dependence, unspecified, uncomplicated: Secondary | ICD-10-CM

## 2017-07-25 ENCOUNTER — Other Ambulatory Visit: Payer: Self-pay | Admitting: Family Medicine

## 2017-07-25 DIAGNOSIS — F17200 Nicotine dependence, unspecified, uncomplicated: Secondary | ICD-10-CM

## 2017-08-04 ENCOUNTER — Ambulatory Visit
Admission: RE | Admit: 2017-08-04 | Discharge: 2017-08-04 | Disposition: A | Discharge status: Home / Self Care | Payer: No Typology Code available for payment source | Source: Ambulatory Visit | Attending: Family Medicine | Admitting: Family Medicine

## 2017-08-04 DIAGNOSIS — F17200 Nicotine dependence, unspecified, uncomplicated: Secondary | ICD-10-CM

## 2017-09-08 DIAGNOSIS — L602 Onychogryphosis: Secondary | ICD-10-CM | POA: Diagnosis not present

## 2017-09-08 DIAGNOSIS — L84 Corns and callosities: Secondary | ICD-10-CM | POA: Diagnosis not present

## 2017-09-08 DIAGNOSIS — E1351 Other specified diabetes mellitus with diabetic peripheral angiopathy without gangrene: Secondary | ICD-10-CM | POA: Diagnosis not present

## 2017-11-22 DIAGNOSIS — E1351 Other specified diabetes mellitus with diabetic peripheral angiopathy without gangrene: Secondary | ICD-10-CM | POA: Diagnosis not present

## 2017-11-22 DIAGNOSIS — L84 Corns and callosities: Secondary | ICD-10-CM | POA: Diagnosis not present

## 2017-11-22 DIAGNOSIS — L602 Onychogryphosis: Secondary | ICD-10-CM | POA: Diagnosis not present

## 2017-11-24 DIAGNOSIS — Z23 Encounter for immunization: Secondary | ICD-10-CM | POA: Diagnosis not present

## 2018-02-09 DIAGNOSIS — M21612 Bunion of left foot: Secondary | ICD-10-CM | POA: Diagnosis not present

## 2018-02-09 DIAGNOSIS — E1351 Other specified diabetes mellitus with diabetic peripheral angiopathy without gangrene: Secondary | ICD-10-CM | POA: Diagnosis not present

## 2018-02-09 DIAGNOSIS — L602 Onychogryphosis: Secondary | ICD-10-CM | POA: Diagnosis not present

## 2018-02-09 DIAGNOSIS — M2042 Other hammer toe(s) (acquired), left foot: Secondary | ICD-10-CM | POA: Diagnosis not present

## 2018-02-09 DIAGNOSIS — L84 Corns and callosities: Secondary | ICD-10-CM | POA: Diagnosis not present

## 2018-04-20 DIAGNOSIS — M2042 Other hammer toe(s) (acquired), left foot: Secondary | ICD-10-CM | POA: Diagnosis not present

## 2018-04-20 DIAGNOSIS — M2041 Other hammer toe(s) (acquired), right foot: Secondary | ICD-10-CM | POA: Diagnosis not present

## 2018-04-20 DIAGNOSIS — M21612 Bunion of left foot: Secondary | ICD-10-CM | POA: Diagnosis not present

## 2018-04-20 DIAGNOSIS — L84 Corns and callosities: Secondary | ICD-10-CM | POA: Diagnosis not present

## 2018-04-20 DIAGNOSIS — E1351 Other specified diabetes mellitus with diabetic peripheral angiopathy without gangrene: Secondary | ICD-10-CM | POA: Diagnosis not present

## 2018-04-20 DIAGNOSIS — L602 Onychogryphosis: Secondary | ICD-10-CM | POA: Diagnosis not present

## 2018-04-20 DIAGNOSIS — M21611 Bunion of right foot: Secondary | ICD-10-CM | POA: Diagnosis not present

## 2018-06-22 DIAGNOSIS — L84 Corns and callosities: Secondary | ICD-10-CM | POA: Diagnosis not present

## 2018-06-22 DIAGNOSIS — L602 Onychogryphosis: Secondary | ICD-10-CM | POA: Diagnosis not present

## 2018-06-22 DIAGNOSIS — E1351 Other specified diabetes mellitus with diabetic peripheral angiopathy without gangrene: Secondary | ICD-10-CM | POA: Diagnosis not present

## 2018-09-14 DIAGNOSIS — L602 Onychogryphosis: Secondary | ICD-10-CM | POA: Diagnosis not present

## 2018-09-14 DIAGNOSIS — L84 Corns and callosities: Secondary | ICD-10-CM | POA: Diagnosis not present

## 2018-09-14 DIAGNOSIS — E1351 Other specified diabetes mellitus with diabetic peripheral angiopathy without gangrene: Secondary | ICD-10-CM | POA: Diagnosis not present

## 2019-03-15 DIAGNOSIS — L602 Onychogryphosis: Secondary | ICD-10-CM | POA: Diagnosis not present

## 2019-03-15 DIAGNOSIS — E1351 Other specified diabetes mellitus with diabetic peripheral angiopathy without gangrene: Secondary | ICD-10-CM | POA: Diagnosis not present

## 2019-03-15 DIAGNOSIS — L84 Corns and callosities: Secondary | ICD-10-CM | POA: Diagnosis not present

## 2019-04-25 ENCOUNTER — Ambulatory Visit: Payer: No Typology Code available for payment source | Attending: Internal Medicine

## 2019-04-25 DIAGNOSIS — Z23 Encounter for immunization: Secondary | ICD-10-CM | POA: Insufficient documentation

## 2019-04-25 NOTE — Progress Notes (Signed)
   Covid-19 Vaccination Clinic  Name:  Jeffrey Stevens    MRN: [id number] DOB: 1950-06-26  04/25/2019  Jeffrey Stevens was observed post Covid-19 immunization for 15 minutes without incident. He was provided with Vaccine Information Sheet and instruction to access the V-Safe system.   Jeffrey Stevens was instructed to call 911 with any severe reactions post vaccine: Marland Kitchen Difficulty breathing  . Swelling of face and throat  . A fast heartbeat  . A bad rash all over body  . Dizziness and weakness   Immunizations Administered    Name Date Dose VIS Date Route   Pfizer COVID-19 Vaccine 04/25/2019  2:58 PM 0.3 mL 02/01/2019 Intramuscular   Manufacturer: [employer], [employer]   Lot: [id number]   NDC: [address]-9

## 2019-05-22 ENCOUNTER — Ambulatory Visit: Payer: No Typology Code available for payment source | Attending: Internal Medicine

## 2019-05-22 DIAGNOSIS — Z23 Encounter for immunization: Secondary | ICD-10-CM

## 2019-05-22 NOTE — Progress Notes (Signed)
   Covid-19 Vaccination Clinic  Name:  Jeffrey Stevens    MRN: [id number] DOB: [birth year]  05/22/2019  [name] was observed post Covid-19 immunization for 15 minutes without incident. He was provided with Vaccine Information Sheet and instruction to access the V-Safe system.   [name] was instructed to call 911 with any severe reactions post vaccine: Marland Kitchen Difficulty breathing  . Swelling of face and throat  . A fast heartbeat  . A bad rash all over body  . Dizziness and weakness   Immunizations Administered    Name Date Dose VIS Date Route   Pfizer COVID-19 Vaccine 05/22/2019  4:43 PM 0.3 mL 02/01/2019 Intramuscular   Manufacturer: [employer], [employer]   Lot: [id number]   NDC: [address]-9

## 2019-08-07 ENCOUNTER — Ambulatory Visit
Admission: RE | Admit: 2019-08-07 | Discharge: 2019-08-07 | Disposition: A | Discharge status: Home / Self Care | Payer: No Typology Code available for payment source | Source: Ambulatory Visit | Attending: Family Medicine | Admitting: Family Medicine

## 2019-08-07 ENCOUNTER — Other Ambulatory Visit: Payer: Self-pay | Admitting: Family Medicine

## 2019-08-07 DIAGNOSIS — R059 Cough, unspecified: Secondary | ICD-10-CM

## 2020-03-28 ENCOUNTER — Other Ambulatory Visit: Payer: Self-pay

## 2020-03-28 ENCOUNTER — Emergency Department (HOSPITAL_COMMUNITY)
Admission: EM | Admit: 2020-03-28 | Discharge: 2020-03-28 | Disposition: A | Discharge status: Home / Self Care | Payer: No Typology Code available for payment source | Attending: Emergency Medicine | Admitting: Emergency Medicine

## 2020-03-28 ENCOUNTER — Emergency Department (HOSPITAL_COMMUNITY): Payer: No Typology Code available for payment source

## 2020-03-28 ENCOUNTER — Encounter (HOSPITAL_COMMUNITY): Payer: Self-pay

## 2020-03-28 DIAGNOSIS — R1110 Vomiting, unspecified: Secondary | ICD-10-CM | POA: Diagnosis not present

## 2020-03-28 DIAGNOSIS — Z7951 Long term (current) use of inhaled steroids: Secondary | ICD-10-CM | POA: Diagnosis not present

## 2020-03-28 DIAGNOSIS — R519 Headache, unspecified: Secondary | ICD-10-CM | POA: Insufficient documentation

## 2020-03-28 DIAGNOSIS — I10 Essential (primary) hypertension: Secondary | ICD-10-CM | POA: Diagnosis not present

## 2020-03-28 DIAGNOSIS — F17210 Nicotine dependence, cigarettes, uncomplicated: Secondary | ICD-10-CM | POA: Diagnosis not present

## 2020-03-28 DIAGNOSIS — Z20822 Contact with and (suspected) exposure to covid-19: Secondary | ICD-10-CM | POA: Diagnosis not present

## 2020-03-28 DIAGNOSIS — Z794 Long term (current) use of insulin: Secondary | ICD-10-CM | POA: Insufficient documentation

## 2020-03-28 DIAGNOSIS — J441 Chronic obstructive pulmonary disease with (acute) exacerbation: Secondary | ICD-10-CM | POA: Diagnosis not present

## 2020-03-28 DIAGNOSIS — E119 Type 2 diabetes mellitus without complications: Secondary | ICD-10-CM | POA: Diagnosis not present

## 2020-03-28 DIAGNOSIS — Z7984 Long term (current) use of oral hypoglycemic drugs: Secondary | ICD-10-CM | POA: Insufficient documentation

## 2020-03-28 DIAGNOSIS — H53132 Sudden visual loss, left eye: Secondary | ICD-10-CM | POA: Insufficient documentation

## 2020-03-28 DIAGNOSIS — Z79899 Other long term (current) drug therapy: Secondary | ICD-10-CM | POA: Insufficient documentation

## 2020-03-28 DIAGNOSIS — G43109 Migraine with aura, not intractable, without status migrainosus: Secondary | ICD-10-CM

## 2020-03-28 LAB — CBC WITH DIFFERENTIAL/PLATELET
Abs Immature Granulocytes: 0.01 10*3/uL (ref 0.00–0.07)
Basophils Absolute: 0.1 10*3/uL (ref 0.0–0.1)
Basophils Relative: 1 %
Eosinophils Absolute: 0.1 10*3/uL (ref 0.0–0.5)
Eosinophils Relative: 1 %
HCT: 41.9 % (ref 39.0–52.0)
Hemoglobin: 14 g/dL (ref 13.0–17.0)
Immature Granulocytes: 0 %
Lymphocytes Relative: 24 %
Lymphs Abs: 1.7 10*3/uL (ref 0.7–4.0)
MCH: 32.3 pg (ref 26.0–34.0)
MCHC: 33.4 g/dL (ref 30.0–36.0)
MCV: 96.8 fL (ref 80.0–100.0)
Monocytes Absolute: 0.5 10*3/uL (ref 0.1–1.0)
Monocytes Relative: 8 %
Neutro Abs: 4.6 10*3/uL (ref 1.7–7.7)
Neutrophils Relative %: 66 %
Platelets: 189 10*3/uL (ref 150–400)
RBC: 4.33 MIL/uL (ref 4.22–5.81)
RDW: 14.8 % (ref 11.5–15.5)
WBC: 7 10*3/uL (ref 4.0–10.5)
nRBC: 0 % (ref 0.0–0.2)

## 2020-03-28 LAB — BASIC METABOLIC PANEL
Anion gap: 8 (ref 5–15)
BUN: 10 mg/dL (ref 8–23)
CO2: 27 mmol/L (ref 22–32)
Calcium: 9.5 mg/dL (ref 8.9–10.3)
Chloride: 107 mmol/L (ref 98–111)
Creatinine, Ser: 1.02 mg/dL (ref 0.61–1.24)
GFR, Estimated: >60 mL/min (ref >60)
Glucose, Bld: 107 mg/dL — ABNORMAL HIGH (ref 70–99)
Potassium: 4.2 mmol/L (ref 3.5–5.1)
Sodium: 142 mmol/L (ref 135–145)

## 2020-03-28 LAB — POC SARS CORONAVIRUS 2 AG -  ED: SARS Coronavirus 2 Ag: NEGATIVE

## 2020-03-28 LAB — SEDIMENTATION RATE: Sed Rate: 16 mm/hr (ref 0–16)

## 2020-03-28 MED ORDER — DEXAMETHASONE SODIUM PHOSPHATE 10 MG/ML IJ SOLN
10.0000 mg | Freq: Once | INTRAMUSCULAR | Status: AC
  Administered 2020-03-28: 10 mg via INTRAVENOUS
  Filled 2020-03-28: qty 1

## 2020-03-28 MED ORDER — METOCLOPRAMIDE HCL 10 MG PO TABS: 10.0000 mg | ORAL_TABLET | Freq: Four times a day (QID) | ORAL | 0 refills | Status: DC | PRN

## 2020-03-28 MED ORDER — DIPHENHYDRAMINE HCL 50 MG/ML IJ SOLN
25.0000 mg | Freq: Once | INTRAMUSCULAR | Status: AC
  Administered 2020-03-28: 25 mg via INTRAVENOUS
  Filled 2020-03-28: qty 1

## 2020-03-28 MED ORDER — LORAZEPAM 2 MG/ML IJ SOLN
1.0000 mg | Freq: Once | INTRAMUSCULAR | Status: AC
  Administered 2020-03-28: 1 mg via INTRAVENOUS
  Filled 2020-03-28: qty 1

## 2020-03-28 MED ORDER — METOCLOPRAMIDE HCL 5 MG/ML IJ SOLN
10.0000 mg | Freq: Once | INTRAMUSCULAR | Status: AC
  Administered 2020-03-28: 10 mg via INTRAVENOUS
  Filled 2020-03-28: qty 2

## 2020-03-28 MED ORDER — SODIUM CHLORIDE 0.9 % IV BOLUS
1000.0000 mL | Freq: Once | INTRAVENOUS | Status: AC
  Administered 2020-03-28: 1000 mL via INTRAVENOUS

## 2020-03-28 NOTE — Discharge Instructions (Signed)
Take or Motrin for headaches.  Take Reglan for nausea and headaches.  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  Follow-up with neurology.  Return to ER if you have worse blurry vision, double vision, headaches, vomiting.

## 2020-03-28 NOTE — ED Provider Notes (Signed)
[facility] DEPT Provider Note   CSN: [id number] Arrival date & time: 03/28/20  1325     History Chief Complaint  Patient presents with  . Headache  . Left eye drooping    Jeffrey Stevens is a 70 y.o. male hx of CAP, COPD, DM, here presenting with headache, left eye blurry vision and left eye droopiness.  Patient states that he has been having left-sided headaches for the last week or so.  He states that it is centered around the left eye.  Today he had associated vomiting and that brought him in for evaluation.  Patient denies any slurred speech.  Patient has no history of stroke in the past.   The history is provided by the patient.       Past Medical History:  Diagnosis Date  . Diabetes mellitus without complication ([place])   . Dyslipidemia   . Hypertension   . Pneumonia 05/23/2014    Patient Active Problem List   Diagnosis Date Noted  . CAP (community acquired pneumonia) 05/24/2014  . Sepsis due to pneumonia ([place]) 05/23/2014  . Influenza A with pneumonia 05/23/2014  . COPD exacerbation ([place]) 05/23/2014  . Diabetes type 2, uncontrolled ([place]) 05/23/2014  . HLD (hyperlipidemia) 05/23/2014  . Tobacco abuse 05/23/2014  . Essential hypertension   . Deficiency anemia 06/05/2013    Past Surgical History:  Procedure Laterality Date  . COLONOSCOPY  2015       Family History  Problem Relation Age of Onset  . Diabetes Mother   . Diabetes Father     Social History   Tobacco Use  . Smoking status: Current Every Day Smoker    Packs/day: 1.00    Years: 50.00    Pack years: 50.00    Types: Cigarettes  . Smokeless tobacco: Never Used  Vaping Use  . Vaping Use: Never used  Substance Use Topics  . Alcohol use: No  . Drug use: No    Home Medications Prior to Admission medications   Medication Sig Start Date End Date Taking? Authorizing Provider  Saxagliptin-Metformin 2.06-998 MG TB24 Take 2 tablets by mouth daily with supper.   Yes  [provider]  ACCU-CHEK SOFTCLIX LANCETS lancets  09/16/13   [provider]  BD PEN NEEDLE NANO U/F 32G X 4 MM MISC See admin instructions. 04/09/15   [provider]  Bromocriptine Mesylate (CYCLOSET) 0.8 MG TABS Take 4 tablets by mouth every morning.    [provider]  ferrous sulfate 325 (65 FE) MG tablet Take 325 mg by mouth daily with breakfast.    [provider]  Insulin Glargine (BASAGLAR KWIKPEN) 100 UNIT/ML SOPN Inject 50 Units into the skin daily. 06/03/16   [provider]  lisinopril-hydrochlorothiazide (PRINZIDE,ZESTORETIC) 20-12.5 MG per tablet Take 1 tablet by mouth daily. 06/01/14   Reyne Dumas, MD  Multiple Vitamin (MULTIVITAMIN) tablet Take 1 tablet by mouth daily.    [provider]  ONE TOUCH ULTRA TEST test strip  09/16/13   [provider]  rosuvastatin (CRESTOR) 10 MG tablet Take 10 mg by mouth daily. 06/16/17   [provider]  VENTOLIN HFA 108 (90 Base) MCG/ACT inhaler INHALE 2 PUFFS 4 TIMES A DAY 05/04/17   [provider]    Allergies    Patient has no known allergies.  Review of Systems   Review of Systems  Neurological: Positive for headaches.  All other systems reviewed and are negative.   Physical Exam Updated Vital  Signs BP 131/73   Pulse 76   Temp 98.4 F (36.9 C) (Oral)   Resp 18   SpO2 97%   Physical Exam Vitals and nursing note reviewed.  Constitutional:      Comments: Slightly uncomfortable  HENT:     Head: Normocephalic.     Mouth/Throat:     Mouth: Mucous membranes are moist.  Eyes:     Extraocular Movements: Extraocular movements intact.     Pupils: Pupils are equal, round, and reactive to light.     Comments: Extraocular movements intact.  Possible left upper visual field cut bilaterally.  Mild tenderness over the left temporal area  Cardiovascular:     Rate and Rhythm: Normal rate and regular rhythm.     Heart sounds: Normal heart sounds.   Pulmonary:     Effort: Pulmonary effort is normal.     Breath sounds: Normal breath sounds.  Abdominal:     General: Bowel sounds are normal.     Palpations: Abdomen is soft.  Musculoskeletal:        General: Normal range of motion.     Cervical back: Normal range of motion and neck supple.  Skin:    General: Skin is warm.  Neurological:     Mental Status: He is alert and oriented to person, place, and time.     Comments: CN 2- 12 intact, nl strength throughout, nl gait   Psychiatric:        Mood and Affect: Mood normal.        Behavior: Behavior normal.     ED Results / Procedures / Treatments   Labs (all labs ordered are listed, but only abnormal results are displayed) Labs Reviewed  BASIC METABOLIC PANEL - Abnormal; Notable for the following components:      Result Value   Glucose, Bld 107 (*)    All other components within normal limits  CBC WITH DIFFERENTIAL/PLATELET  SEDIMENTATION RATE  POC SARS CORONAVIRUS 2 AG -  ED    EKG EKG Interpretation  Date/Time:  Saturday March 28 2020 16:10:55 EST Ventricular Rate:  56 PR Interval:    QRS Duration: 83 QT Interval:  429 QTC Calculation: 414 R Axis:   68 Text Interpretation: Sinus rhythm Abnormal R-wave progression, early transition No previous ECGs available Confirmed by Wandra Arthurs [phone]) on 03/28/2020 4:25:07 PM   Radiology CT Head Wo Contrast  Result Date: 03/28/2020 CLINICAL DATA:  Headache EXAM: CT HEAD WITHOUT CONTRAST TECHNIQUE: Contiguous axial images were obtained from the base of the skull through the vertex without intravenous contrast. COMPARISON:  None. FINDINGS: Brain: No evidence of acute infarction, hemorrhage, hydrocephalus, extra-axial collection or mass lesion/mass effect. Periventricular white matter hypodensities consistent with sequela of chronic microvascular ischemic disease. Vascular: No hyperdense vessel or unexpected calcification. Skull: Normal. Negative for fracture or focal lesion.  Sinuses/Orbits: No acute finding. Other: None. IMPRESSION: No acute intracranial abnormality. Electronically Signed   By: Valentino Saxon MD   On: 03/28/2020 14:41   MR BRAIN WO CONTRAST  Result Date: 03/28/2020 CLINICAL DATA:  Transient ischemic attack. Dizziness. Blurred vision left eye. EXAM: MRI HEAD WITHOUT CONTRAST TECHNIQUE: Multiplanar, multiecho pulse sequences of the brain and surrounding structures were obtained without intravenous contrast. COMPARISON:  Head CT same day. FINDINGS: Brain: Diffusion imaging does not show any acute or subacute infarction. Brainstem is normal. 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 Cerebral hemispheres show moderate changes of small vessel disease affecting the deep  and subcortical white matter. No cortical or large vessel territory infarction. No mass lesion, hemorrhage, hydrocephalus or extra-axial collection. Vascular: Major vessels at the base of the brain show flow. Skull and upper cervical spine: Negative Sinuses/Orbits: Sinuses are clear.  Previous lens implants. Other: None IMPRESSION: No acute finding by MRI. Moderate chronic small-vessel ischemic changes of the cerebral hemispheric white matter. 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 Electronically Signed   By: Nelson Chimes M.D.   On: 03/28/2020 19:19    Procedures Procedures   Medications Ordered in ED Medications  metoCLOPramide (REGLAN) injection 10 mg (10 mg Intravenous Given 03/28/20 1658)  diphenhydrAMINE (BENADRYL) injection 25 mg (25 mg Intravenous Given 03/28/20 1702)  sodium chloride 0.9 % bolus 1,000 mL (0 mLs Intravenous Stopped 03/28/20 1817)  dexamethasone (DECADRON) injection 10 mg (10 mg Intravenous Given 03/28/20 1700)  LORazepam (ATIVAN) injection 1 mg (1 mg Intravenous Given 03/28/20 1818)    ED Course  I have reviewed the triage vital signs and the nursing notes.  Pertinent labs & imaging results that were available during my  care of the patient were reviewed by me and considered in my medical decision making (see chart for details).    MDM Rules/Calculators/A&P                         Jeffrey Stevens is a 70 y.o. male here presenting with headache and blurry vision.  He has some tenderness in the left temporal area.  Consider temporal arteritis versus migraines versus stroke.  Will get MRI brain and ESR and labs.  Will give migraine cocktail  8:04 PM Labs are unremarkable.  Patient's ESR is normal.  Patient's MRI showed small chronic small vessel ischemic changes.  He actually saw eye doctor earlier this week and had an eye exam that was unremarkable.  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  His headaches improved with migraine cocktail.  Will refer to neurology outpatient  Final Clinical Impression(s) / ED Diagnoses Final diagnoses:  None    Rx / DC Orders ED Discharge Orders    None       Drenda Freeze, MD 03/28/20 2005

## 2020-03-28 NOTE — ED Triage Notes (Signed)
Pt presents with c/o headache x one week and left eye drooping since Tuesday. No neuro deficits noted. MD made aware and head CT will be ordered.

## 2020-06-02 ENCOUNTER — Ambulatory Visit: Payer: Medicare Other | Admitting: Neurology

## 2020-11-30 ENCOUNTER — Encounter (HOSPITAL_BASED_OUTPATIENT_CLINIC_OR_DEPARTMENT_OTHER): Payer: Self-pay

## 2020-11-30 ENCOUNTER — Ambulatory Visit (HOSPITAL_BASED_OUTPATIENT_CLINIC_OR_DEPARTMENT_OTHER): Admit: 2020-11-30 | Payer: No Typology Code available for payment source | Admitting: Podiatry

## 2020-11-30 SURGERY — CORRECTION, HAMMER TOE
Anesthesia: General | Site: Toe | Laterality: Left

## 2022-05-11 ENCOUNTER — Emergency Department (HOSPITAL_COMMUNITY)
Admission: EM | Admit: 2022-05-11 | Discharge: 2022-05-11 | Disposition: A | Discharge status: Home / Self Care | Payer: BC Managed Care – PPO | Attending: Emergency Medicine | Admitting: Emergency Medicine

## 2022-05-11 ENCOUNTER — Encounter (HOSPITAL_COMMUNITY): Payer: Self-pay

## 2022-05-11 ENCOUNTER — Encounter: Payer: Self-pay | Admitting: Internal Medicine

## 2022-05-11 ENCOUNTER — Emergency Department (HOSPITAL_COMMUNITY): Payer: BC Managed Care – PPO

## 2022-05-11 DIAGNOSIS — E11649 Type 2 diabetes mellitus with hypoglycemia without coma: Secondary | ICD-10-CM | POA: Insufficient documentation

## 2022-05-11 DIAGNOSIS — Z794 Long term (current) use of insulin: Secondary | ICD-10-CM | POA: Diagnosis not present

## 2022-05-11 DIAGNOSIS — E162 Hypoglycemia, unspecified: Secondary | ICD-10-CM

## 2022-05-11 DIAGNOSIS — Z7982 Long term (current) use of aspirin: Secondary | ICD-10-CM | POA: Insufficient documentation

## 2022-05-11 DIAGNOSIS — Z79899 Other long term (current) drug therapy: Secondary | ICD-10-CM | POA: Insufficient documentation

## 2022-05-11 DIAGNOSIS — I10 Essential (primary) hypertension: Secondary | ICD-10-CM | POA: Insufficient documentation

## 2022-05-11 DIAGNOSIS — R410 Disorientation, unspecified: Secondary | ICD-10-CM | POA: Diagnosis present

## 2022-05-11 LAB — CBC WITH DIFFERENTIAL/PLATELET
Abs Immature Granulocytes: 0.01 10*3/uL (ref 0.00–0.07)
Basophils Absolute: 0.1 10*3/uL (ref 0.0–0.1)
Basophils Relative: 1 %
Eosinophils Absolute: 0.2 10*3/uL (ref 0.0–0.5)
Eosinophils Relative: 3 %
HCT: 39.8 % (ref 39.0–52.0)
Hemoglobin: 13.3 g/dL (ref 13.0–17.0)
Immature Granulocytes: 0 %
Lymphocytes Relative: 22 %
Lymphs Abs: 1.3 10*3/uL (ref 0.7–4.0)
MCH: 32 pg (ref 26.0–34.0)
MCHC: 33.4 g/dL (ref 30.0–36.0)
MCV: 95.9 fL (ref 80.0–100.0)
Monocytes Absolute: 0.5 10*3/uL (ref 0.1–1.0)
Monocytes Relative: 9 %
Neutro Abs: 4.1 10*3/uL (ref 1.7–7.7)
Neutrophils Relative %: 65 %
Platelets: 176 10*3/uL (ref 150–400)
RBC: 4.15 MIL/uL — ABNORMAL LOW (ref 4.22–5.81)
RDW: 14.4 % (ref 11.5–15.5)
WBC: 6.1 10*3/uL (ref 4.0–10.5)
nRBC: 0 % (ref 0.0–0.2)

## 2022-05-11 LAB — CBG MONITORING, ED
Glucose-Capillary: 26 mg/dL — CL (ref 70–99)
Glucose-Capillary: 28 mg/dL — CL (ref 70–99)
Glucose-Capillary: 156 mg/dL — ABNORMAL HIGH (ref 70–99)
Glucose-Capillary: 194 mg/dL — ABNORMAL HIGH (ref 70–99)
Glucose-Capillary: 254 mg/dL — ABNORMAL HIGH (ref 70–99)
Glucose-Capillary: 133 mg/dL — ABNORMAL HIGH (ref 70–99)

## 2022-05-11 LAB — COMPREHENSIVE METABOLIC PANEL
ALT: 18 U/L (ref 0–44)
AST: 24 U/L (ref 15–41)
Albumin: 4.2 g/dL (ref 3.5–5.0)
Alkaline Phosphatase: 38 U/L (ref 38–126)
Anion gap: 11 (ref 5–15)
BUN: 15 mg/dL (ref 8–23)
CO2: 24 mmol/L (ref 22–32)
Calcium: 9.4 mg/dL (ref 8.9–10.3)
Chloride: 101 mmol/L (ref 98–111)
Creatinine, Ser: 1.11 mg/dL (ref 0.61–1.24)
GFR, Estimated: >60 mL/min (ref >60)
Glucose, Bld: 46 mg/dL — ABNORMAL LOW (ref 70–99)
Potassium: 3.3 mmol/L — ABNORMAL LOW (ref 3.5–5.1)
Sodium: 136 mmol/L (ref 135–145)
Total Bilirubin: 0.7 mg/dL (ref 0.3–1.2)
Total Protein: 7.5 g/dL (ref 6.5–8.1)

## 2022-05-11 MED ORDER — DEXTROSE 50 % IV SOLN
1.0000 | Freq: Once | INTRAVENOUS | Status: AC
  Administered 2022-05-11: 50 mL via INTRAVENOUS
  Filled 2022-05-11: qty 50

## 2022-05-11 NOTE — ED Provider Notes (Signed)
  Provider Note MRN:  [id number]  Arrival date & time: 05/11/22    ED Course and Medical Decision Making  Assumed care from [place] at shift change.  See note from prior team for complete details, in brief:    Plan per prior physician follow-up labs, CT, p.o. challenge  Clinical Course as of 05/11/22 1120  Wed May 11, 2022  0725 72 yo male, hx DM, hypoglycemia/ams. Improving. CTH pending. Obs few hours for glycemic stability, CTH, UA  [SG]    Clinical Course User Index [SG] Wynona Dove A, DO   CT stable Neuro non-focal, feels back to baseline, ambulatory w/ steady gait.   Labs reviewed, glucose initially very low on arrival.  Repeat CBG has normalized.  Patient is on toujeo and saxagliptin.  Patient was able to eat full meal while in the emergency department No nausea or vomiting, family at bedside will take him home and monitor Recommend follow glucose closely over next 24 to 48 hours.  Follow-up PCP in the next 2 to 3 days for recheck and ? medication adjustment  D/w family at length at bedside  The patient improved significantly and was discharged in stable condition. Detailed discussions were had with the patient regarding current findings, and need for close f/u with PCP or on call doctor. The patient has been instructed to return immediately if the symptoms worsen in any way for re-evaluation. Patient verbalized understanding and is in agreement with current care plan. All questions answered prior to discharge.   Procedures  Final Clinical Impressions(s) / ED Diagnoses     ICD-10-CM   1. Hypoglycemia  E16.2       ED Discharge Orders     None         Discharge Instructions      It was a pleasure caring for you today in the emergency department.  Please return to the emergency department for any worsening or worrisome symptoms.  Please monitor blood glucose closely over the next 24 to 48 hours         Jeanell Sparrow, DO 05/11/22 1120

## 2022-05-11 NOTE — ED Notes (Signed)
Pt given turkey sandwich

## 2022-05-11 NOTE — ED Provider Notes (Signed)
EMERGENCY DEPARTMENT AT [facility] Provider Note   CSN: [id number] Arrival date & time: 05/11/22  [id number]     History  Chief Complaint  Patient presents with   Altered Mental Status    CLAUDIUS WALDNER is a 72 y.o. male.  The history is provided by the patient, a relative and a friend.  Altered Mental Status CONSTANT FREYRE is a 72 y.o. male who presents to the Emergency Department complaining of altered mental status.  Level 5 caveat due to confusion.  He presents to the emergency department accompanied by his daughter and his friend for evaluation of altered mental status.  He was last in his routine state of health at 830 last night.  This morning when he got up he seemed confused and did not recognize his friend or daughter and was unsteady.  He does have a history of hypertension and diabetes.  He was sick 2 weeks ago with upper respiratory symptoms but these have since resolved.  He has eaten regular meals and has no additional complaints.  Time of ED assessment patient states that he feels fine.     Home Medications Prior to Admission medications   Medication Sig Start Date End Date Taking? Authorizing Provider  ACCU-CHEK SOFTCLIX LANCETS lancets  09/16/13   [provider]  acetaminophen (TYLENOL) 500 MG tablet Take 500-1,000 mg by mouth every 4 (four) hours as needed for headache or fever (pain).    [provider]  aspirin EC 81 MG tablet Take 81 mg by mouth daily. Swallow whole.    [provider]  BD PEN NEEDLE NANO U/F 32G X 4 MM MISC See admin instructions. 04/09/15   [provider]  Budeson-Glycopyrrol-Formoterol (BREZTRI AEROSPHERE) 160-9-4.8 MCG/ACT AERO Inhale 2 puffs into the lungs at bedtime.    [provider]  ferrous sulfate 325 (65 FE) MG tablet Take 325 mg by mouth daily with breakfast.    [provider]  insulin glargine, 1 Unit Dial, (TOUJEO SOLOSTAR) 300 UNIT/ML Solostar Pen Inject  30 Units into the skin at bedtime.    [provider]  lisinopril-hydrochlorothiazide (PRINZIDE,ZESTORETIC) 20-12.5 MG per tablet Take 1 tablet by mouth daily. 06/01/14   Reyne Dumas, MD  metoCLOPramide (REGLAN) 10 MG tablet Take 1 tablet (10 mg total) by mouth every 6 (six) hours as needed for nausea (nausea/headache). 03/28/20   Drenda Freeze, MD  Multiple Vitamin (MULTIVITAMIN WITH MINERALS) TABS tablet Take 1 tablet by mouth daily.    [provider]  ONE TOUCH ULTRA TEST test strip  09/16/13   [provider]  rosuvastatin (CRESTOR) 10 MG tablet Take 10 mg by mouth daily. 06/16/17   [provider]  Saxagliptin-Metformin 2.06-998 MG TB24 Take 2 tablets by mouth daily with supper. [place]    [provider]      Allergies    Patient has no known allergies.    Review of Systems   Review of Systems  All other systems reviewed and are negative.   Physical Exam Updated Vital Signs BP (!) 145/78 (BP Location: Left Arm)   Pulse 74   Temp (!) 97.5 F (36.4 C) (Oral)   Resp 18   SpO2 100%  Physical Exam Vitals and nursing note reviewed.  Constitutional:      Appearance: He is well-developed.  HENT:     Head: Normocephalic and atraumatic.  Cardiovascular:     Rate and Rhythm: Normal rate and regular rhythm.  Heart sounds: No murmur heard. Pulmonary:     Effort: Pulmonary effort is normal. No respiratory distress.     Breath sounds: Normal breath sounds.  Abdominal:     Palpations: Abdomen is soft.     Tenderness: There is no abdominal tenderness. There is no guarding or rebound.  Musculoskeletal:        General: No tenderness.  Skin:    General: Skin is warm and dry.  Neurological:     Mental Status: He is alert.     Comments: Oriented to person.  Disoriented to time and recent events.  Global weakness.  Psychiatric:        Behavior: Behavior normal.     ED Results / Procedures / Treatments   Labs (all labs ordered  are listed, but only abnormal results are displayed) Labs Reviewed  CBG MONITORING, ED - Abnormal; Notable for the following components:      Result Value   Glucose-Capillary 26 (*)    All other components within normal limits  CBG MONITORING, ED - Abnormal; Notable for the following components:   Glucose-Capillary 28 (*)    All other components within normal limits  CBC WITH DIFFERENTIAL/PLATELET  COMPREHENSIVE METABOLIC PANEL  URINALYSIS, ROUTINE W REFLEX MICROSCOPIC    EKG None  Radiology No results found.  Procedures Procedures    Medications Ordered in ED Medications  dextrose 50 % solution 50 mL (50 mLs Intravenous Given 05/11/22 [id number])    ED Course/ Medical Decision Making/ A&P Clinical Course as of 05/11/22 0731  Wed May 11, 2022  0725 72 yo male, hx DM, hypoglycemia/ams. Improving. CTH pending. Obs few hours for glycemic stability, CTH, UA  [SG]    Clinical Course User Index [SG] Jeanell Sparrow, DO                             Medical Decision Making Amount and/or Complexity of Data Reviewed Labs: ordered. Radiology: ordered.   Pt with hx/o DM here for evaluation of AMS.  Pt confused at time of ED presentation with global weakness, found to by hypoglycemic.  He was treated with dextrose - IV and PO and had improvement in his sxs.  No reported changes in doses, accidental overdose or missed meals.  No significant acute infectious sxs.  Renal function at his baseline.  Pt care transferred pending CT head, UA, repeat cbg and repeat assessment.          Final Clinical Impression(s) / ED Diagnoses Final diagnoses:  None    Rx / DC Orders ED Discharge Orders     None         Quintella Reichert, MD 05/11/22 [phone]

## 2022-05-11 NOTE — Discharge Instructions (Signed)
It was a pleasure caring for you today in the emergency department.  Please return to the emergency department for any worsening or worrisome symptoms.  Please monitor blood glucose closely over the next 24 to 48 hours

## 2022-05-11 NOTE — ED Triage Notes (Signed)
Patient arrived with family who states he woke up disoriented. Last known well 830pm.

## 2022-05-25 ENCOUNTER — Encounter: Payer: Self-pay | Admitting: Internal Medicine

## 2022-11-04 LAB — LAB REPORT - SCANNED
A1c: 7.1
EGFR: 70

## 2022-11-18 IMAGING — CT CT HEAD W/O CM
3 series · 15 of 47 positions shown, 18 images · non-contrast
Comparison: None.

CLINICAL DATA: Headache

EXAM:
CT HEAD WITHOUT CONTRAST
TECHNIQUE: Contiguous axial images were obtained from the base of the skull
through the vertex without intravenous contrast.

[Series 2: head wo · axial · 0.47mm/px · z∈[-103,+27]mm · 9 of 32 slices shown, 12 images]
[im 3/32  brain]
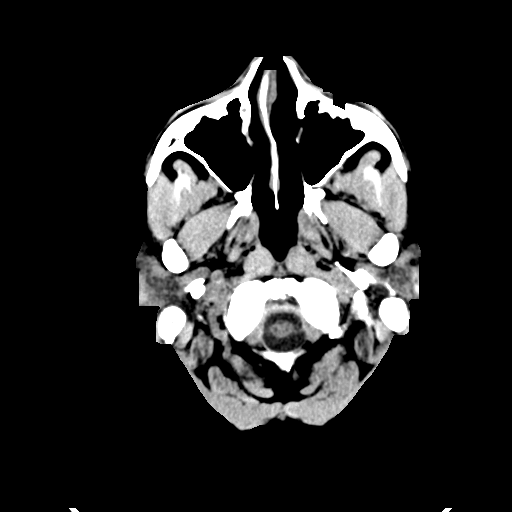
[im 3/32  bone]
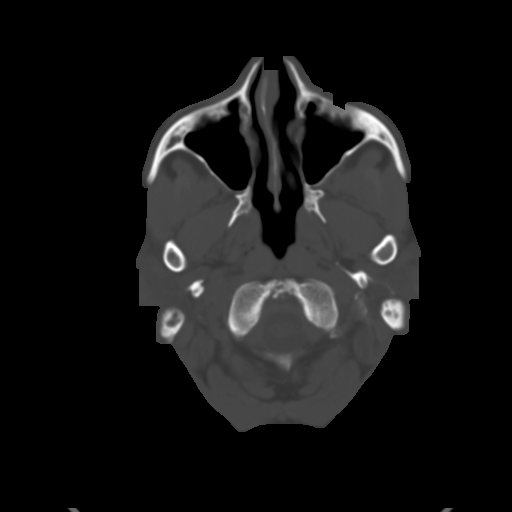
[im 6/32  brain]
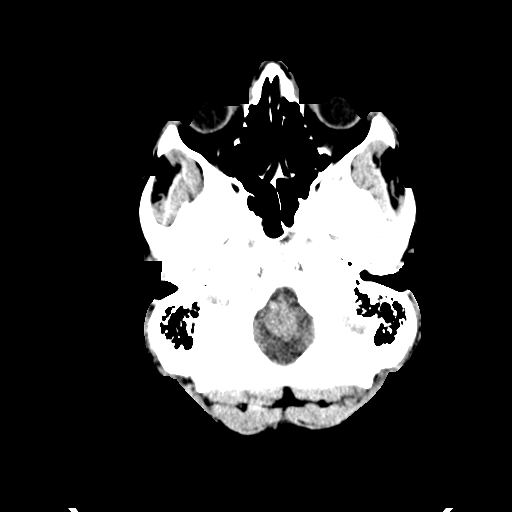
[im 9/32  brain]
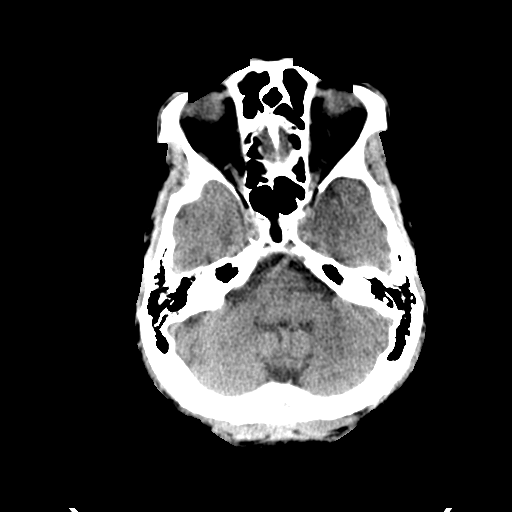
[im 12/32  brain]
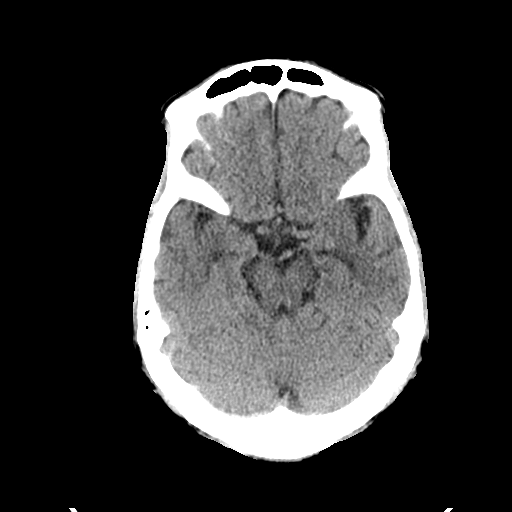
[im 17/32  brain]
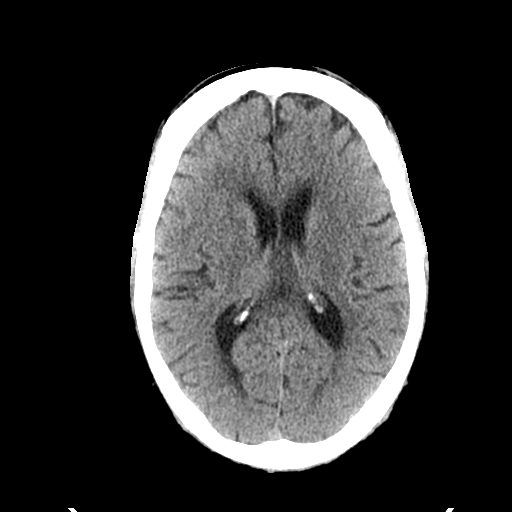
[im 17/32  bone]
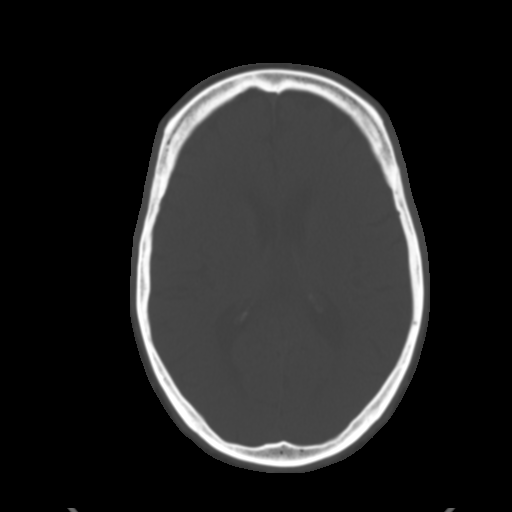
[im 20/32  brain]
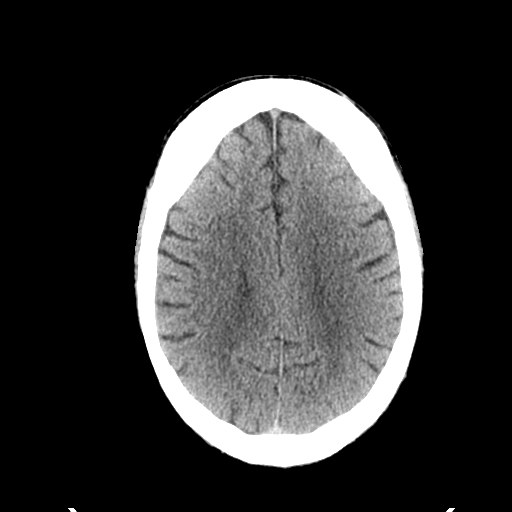
[im 23/32  brain]
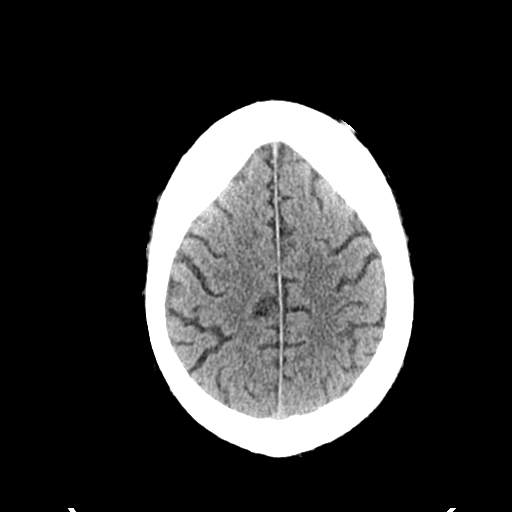
[im 26/32  brain]
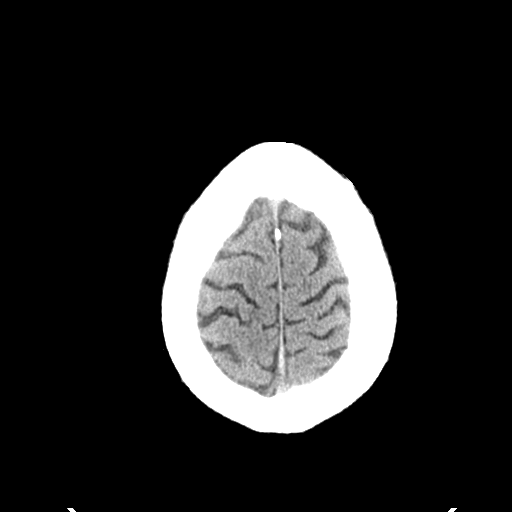
[im 29/32  brain]
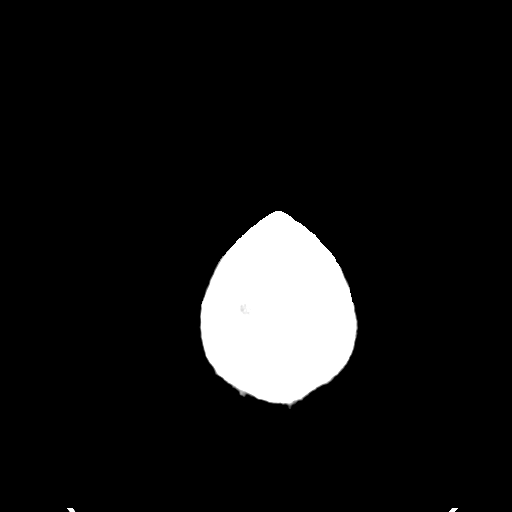
[im 29/32  bone]
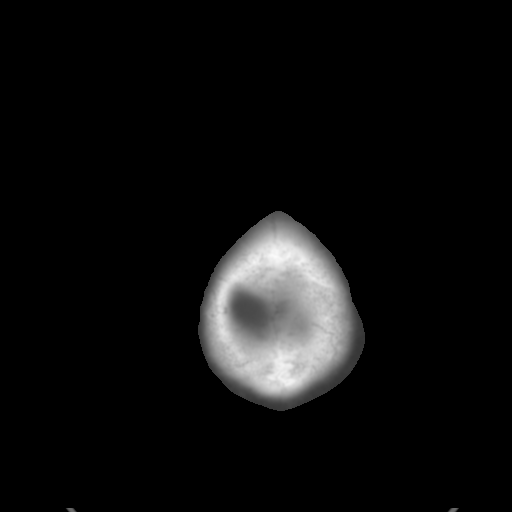

[Series 5: coronal soft tissue · coronal · 0.31mm/px · 3 of 76 slices shown]
[im 26/76  brain]
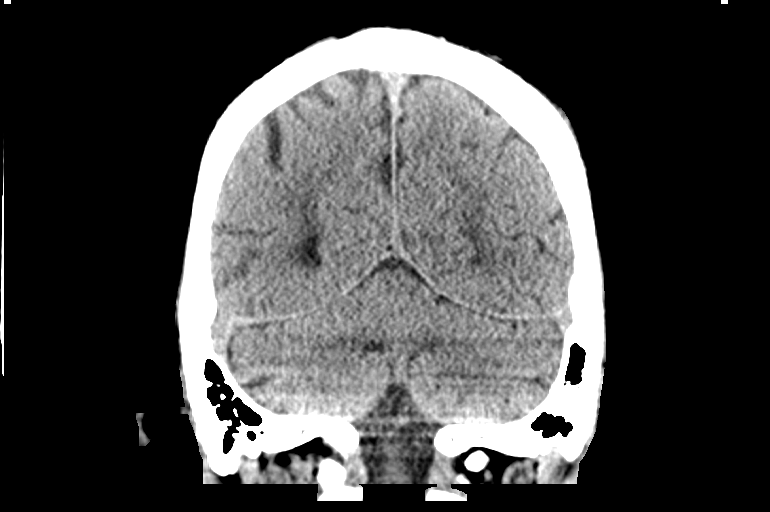
[im 34/76  brain]
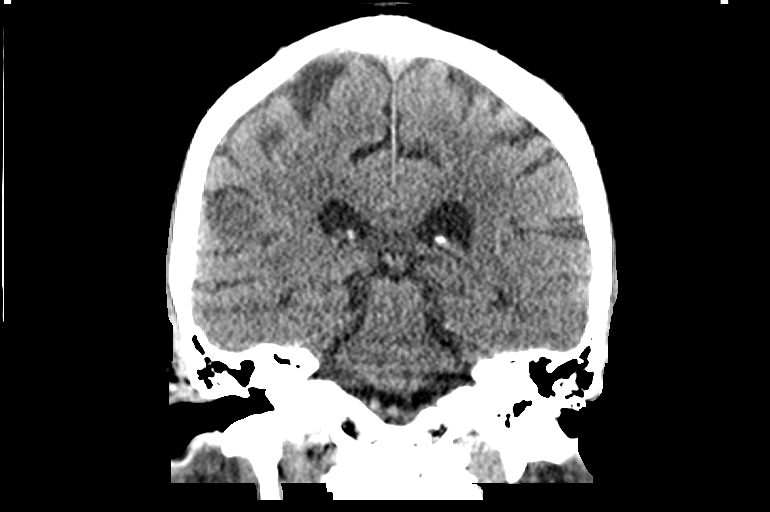
[im 42/76  brain]
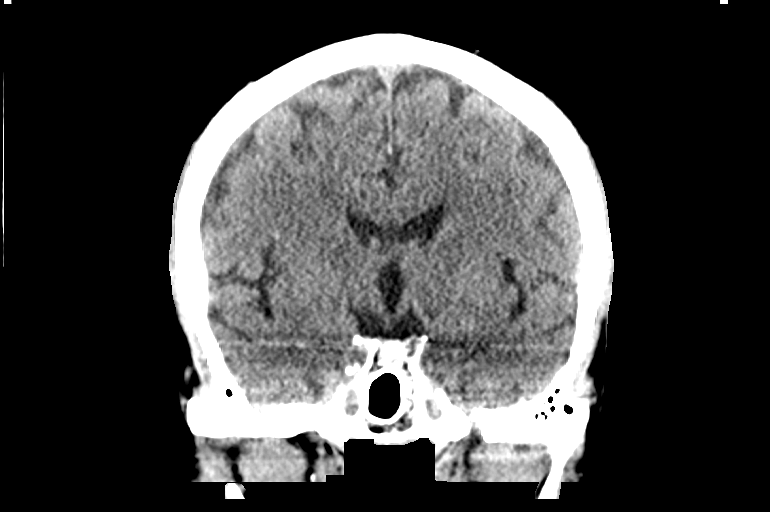

[Series 6: sagittal soft tissue · sagittal · 0.38mm/px · 3 of 60 slices shown]
[im 20/60  brain]
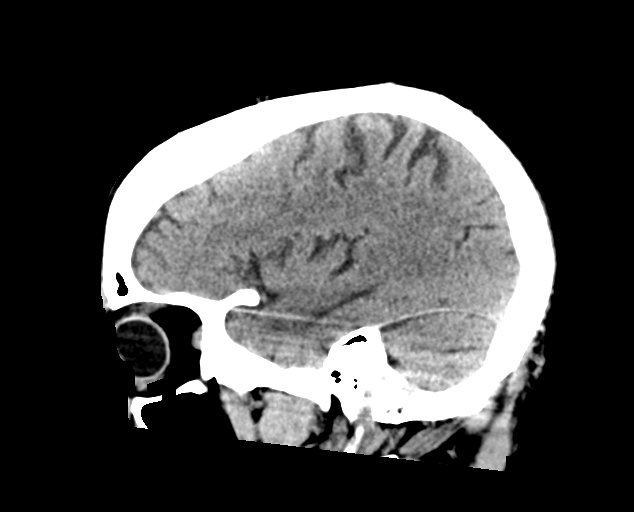
[im 30/60  brain]
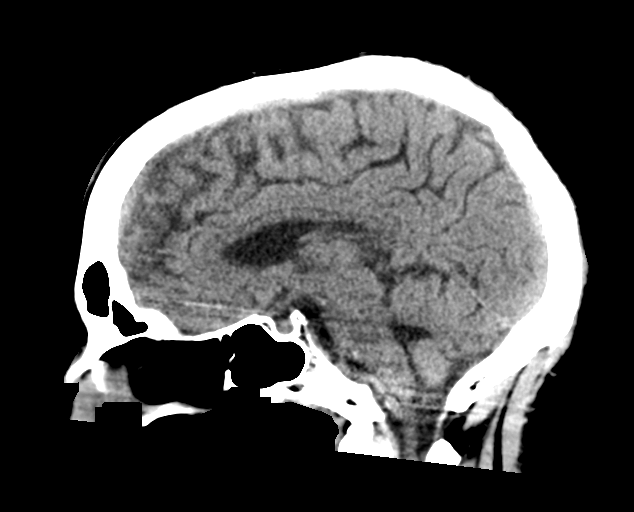
[im 40/60  brain]
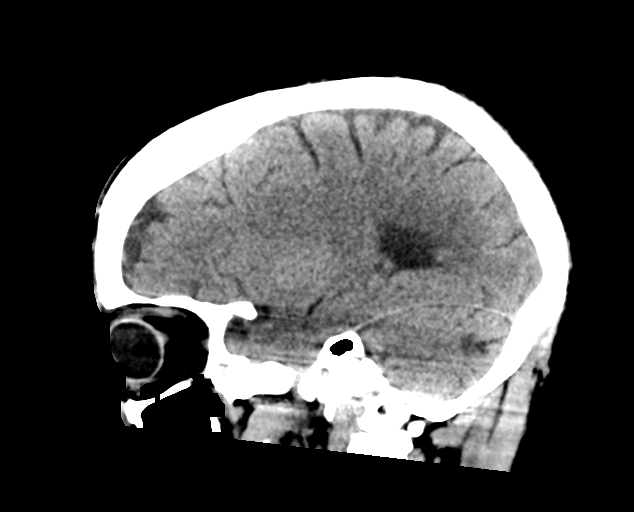

[15 of 47 positions shown; findings below may reference images not displayed]

FINDINGS: Brain: No evidence of acute infarction, hemorrhage, hydrocephalus,
extra-axial collection or mass lesion/mass effect. Periventricular
white matter hypodensities consistent with sequela of chronic
microvascular ischemic disease.

Vascular: No hyperdense vessel or unexpected calcification.

Skull: Normal. Negative for fracture or focal lesion.

Sinuses/Orbits: No acute finding.

Other: None.
IMPRESSION: No acute intracranial abnormality.

## 2022-11-24 ENCOUNTER — Ambulatory Visit: Payer: BC Managed Care – PPO | Admitting: Dietician

## 2023-02-03 ENCOUNTER — Encounter: Payer: BC Managed Care – PPO | Attending: Family Medicine | Admitting: Dietician

## 2023-02-03 ENCOUNTER — Encounter: Payer: Self-pay | Admitting: Dietician

## 2023-02-03 VITALS — Ht 69.0 in | Wt 123.0 lb

## 2023-02-03 DIAGNOSIS — E1169 Type 2 diabetes mellitus with other specified complication: Secondary | ICD-10-CM | POA: Diagnosis present

## 2023-02-03 NOTE — Patient Instructions (Addendum)
Sleep Hygiene:  Turn the TV off 1 hour before you are to go to bed.  Serenity Prayer  Do something relaxing before bed such as listen to music, take a hot shower.  Keep a notebook by your bed and write down things that you need to remember or things that are troubling you Consider a magnesium supplement before bed such as Magnesium Glycinate.  Get fresh air and sunshine every day.  Take your Toujeo daily as prescribed Consider quitting smoking - instead of a cigarette, take a snack  Do something to bring you joy every day!  [facility] Meetings - Friday from 7-9 [phone] - call to inquire [employer] [address] ([address])

## 2023-02-03 NOTE — Progress Notes (Unsigned)
Diabetes Self-Management Education  Visit Type: First/Initial  Appt. Start Time: 1500 Appt. End Time: 1620  Patient is here today with his wife. Some depression, not getting enough sleep due to stress.  He is stressed about getting older, work, weight loss, health. Tired all the time since the weight loss.  Feels weak.  Hand grip is less.  Referral:  Type 2 Diabetes, abnormal weight loss History includes:  Type 2 Diabetes, HTN, smoking (almost a pack), alcoholism (has not drank in 25 years) Labs noted to include:  A1C 7.1% 11/03/2022, hemoglobin 12, vitamin D 46 on 11/03/2022 Medications:  Toujeo 15 units q HS, saxagliptin-Metformin (Janumet), MVI, iron,  CGM:  Libre 3   CGM Results from download: 02/03/23  % Time CGM active:    96%   (Goal >70%)  Average glucose:   169 mg/dL for 14 days  Glucose management indicator:   7.4 %  Time in range (70-180 mg/dL):   60 %   (Goal >87%)  Time High (181-250 mg/dL):   27 %   (Goal < 56%)  Time Very High (>250 mg/dL):    43%   (Goal < 5%)  Time Low (54-69 mg/dL):   1 %   (Goal <3%)  Time Very Low (<54 mg/dL):   0 %   (Goal <2%)  %CV (glucose variability)     %  (Goal <36%)   69" 123 lbs  02/02/2023 155 lbs highest weight this year 05/2022  Started losing weight when he had more bills and less sleep.  Patient lives with his wife.  His wife does the shopping and cooking. 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  He states that he has to stay busy.   He does not get any exercise other than work.  States that he is too tired to do more outside of work. 02/06/2023  [name], identified by name and date of birth, is a 72 y.o. male with a diagnosis of Diabetes: Type 2.   ASSESSMENT  Height 5\' 9"  (1.753 m), weight 123 lb (55.8 kg). Body mass index is 18.16 kg/m.   Diabetes Self-Management Education - 02/03/23 1603       Visit Information   Visit Type First/Initial      Initial Visit   Diabetes Type Type 2    Are you taking  your medications as prescribed? Yes      Psychosocial Assessment   What is the hardest part about your diabetes right now, causing you the most concern, or is the most worrisome to you about your diabetes?   Getting support / problem solving    Self-care barriers Other (comment)   stress   Self-management support Doctor's office    Other persons present Patient;Spouse/SO    Patient Concerns Nutrition/Meal planning;Problem Solving    Special Needs None    Preferred Learning Style No preference indicated    Learning Readiness Ready    How often do you need to have someone help you when you read instructions, pamphlets, or other written materials from your doctor or pharmacy? 3 - Sometimes    What is the last grade level you completed in school? 12      Pre-Education Assessment   Patient understands the diabetes disease and treatment process. Needs Review    Patient understands incorporating nutritional management into lifestyle. Needs Review    Patient undertands incorporating physical activity into lifestyle. Needs Review    Patient understands using medications safely. Needs Review  Patient understands monitoring blood glucose, interpreting and using results Needs Review    Patient understands prevention, detection, and treatment of acute complications. Needs Review    Patient understands prevention, detection, and treatment of chronic complications. Needs Review    Patient understands how to develop strategies to address psychosocial issues. Needs Review    Patient understands how to develop strategies to promote health/change behavior. Needs Review      Complications   Last HgB A1C per patient/outside source 7.1 %    How often do you check your blood sugar? > 4 times/day    Number of hypoglycemic episodes per month 1    Can you tell when your blood sugar is low? Yes      Dietary Intake   Breakfast protein bar    Snack (morning) banana, NABS    Lunch tuna salad on sub roll  ([employer]) today OR leftovers    Snack (afternoon) peanuts, Glucerna Shake    Dinner vegetable, starch, meat    Snack (evening) peanuts, popcorn    Beverage(s) water, coffee with cream and monk fruit, diet Pepsi, Glucerna shake daily      Activity / Exercise   Activity / Exercise Type Light (walking / raking leaves)   work related     Patient Education   Previous Diabetes Education Yes (please comment)   unknown   Healthy Eating Meal options for control of blood glucose level and chronic complications.;Other (comment)   tips for weight gain   Being Active Role of exercise on diabetes management, blood pressure control and cardiac health.    Medications Reviewed patients medication for diabetes, action, purpose, timing of dose and side effects.    Monitoring Taught/evaluated CGM (comment);Identified appropriate SMBG and/or A1C goals.    Diabetes Stress and Support Identified and addressed patients feelings and concerns about diabetes;Worked with patient to identify barriers to care and solutions;Role of stress on diabetes    Lifestyle and Health Coping Review risk of smoking and offered smoking cessation      Individualized Goals (developed by patient)   Nutrition General guidelines for healthy choices and portions discussed    Physical Activity Exercise 5-7 days per week;30 minutes per day    Medications take my medication as prescribed    Monitoring  Consistenly use CGM    Problem Solving Eating Pattern    Reducing Risk examine blood glucose patterns;do foot checks daily;stop smoking;treat hypoglycemia with 15 grams of carbs if blood glucose less than 70mg /dL    Health Coping Ask for help with psychological, social, or emotional issues      Post-Education Assessment   Patient understands the diabetes disease and treatment process. Comprehends key points    Patient understands incorporating nutritional management into lifestyle. Comprehends key points    Patient undertands  incorporating physical activity into lifestyle. Comprehends key points    Patient understands using medications safely. Comphrehends key points    Patient understands monitoring blood glucose, interpreting and using results Comprehends key points    Patient understands prevention, detection, and treatment of acute complications. Comprehends key points    Patient understands prevention, detection, and treatment of chronic complications. Comprehends key points    Patient understands how to develop strategies to address psychosocial issues. Comprehends key points    Patient understands how to develop strategies to promote health/change behavior. Needs Review      Outcomes   Expected Outcomes Demonstrated interest in learning. Expect positive outcomes    Future DMSE 2 months  Program Status Not Completed             Individualized Plan for Diabetes Self-Management Training:   Learning Objective:  Patient will have a greater understanding of diabetes self-management. Patient education plan is to attend individual and/or group sessions per assessed needs and concerns.   Plan:   Patient Instructions  Sleep Hygiene:  Turn the TV off 1 hour before you are to go to bed.  Serenity Prayer  Do something relaxing before bed such as listen to music, take a hot shower.  Keep a notebook by your bed and write down things that you need to remember or things that are troubling you Consider a magnesium supplement before bed such as Magnesium Glycinate.  Get fresh air and sunshine every day.  Take your Toujeo daily as prescribed Consider quitting smoking - instead of a cigarette, take a snack  Do something to bring you joy every day!  [facility] Meetings - Friday from 7-9 [phone] - call to inquire [employer] [address] ([address]   Expected Outcomes:  Demonstrated interest in learning. Expect positive outcomes  Education material provided: ADA - How to Thrive: A  Guide for Your Journey with Diabetes  If problems or questions, patient to contact team via:  Phone  Future DSME appointment: 2 months

## 2023-02-20 ENCOUNTER — Other Ambulatory Visit: Payer: Self-pay | Admitting: Nurse Practitioner

## 2023-02-20 DIAGNOSIS — Z720 Tobacco use: Secondary | ICD-10-CM

## 2023-02-20 DIAGNOSIS — R634 Abnormal weight loss: Secondary | ICD-10-CM

## 2023-02-27 ENCOUNTER — Ambulatory Visit: Payer: BC Managed Care – PPO | Admitting: Dietician

## 2023-03-10 ENCOUNTER — Ambulatory Visit
Admission: RE | Admit: 2023-03-10 | Discharge: 2023-03-10 | Disposition: A | Discharge status: Home / Self Care | Payer: Medicare Other | Source: Ambulatory Visit | Attending: Nurse Practitioner | Admitting: Nurse Practitioner

## 2023-03-10 DIAGNOSIS — R634 Abnormal weight loss: Secondary | ICD-10-CM

## 2023-03-10 DIAGNOSIS — Z720 Tobacco use: Secondary | ICD-10-CM

## 2023-04-07 ENCOUNTER — Ambulatory Visit: Payer: BC Managed Care – PPO | Admitting: Dietician

## 2023-04-09 ENCOUNTER — Emergency Department (HOSPITAL_BASED_OUTPATIENT_CLINIC_OR_DEPARTMENT_OTHER)
Admission: EM | Admit: 2023-04-09 | Discharge: 2023-04-09 | Disposition: A | Discharge status: Home / Self Care | Payer: Medicare Other | Attending: Emergency Medicine | Admitting: Emergency Medicine

## 2023-04-09 ENCOUNTER — Encounter (HOSPITAL_BASED_OUTPATIENT_CLINIC_OR_DEPARTMENT_OTHER): Payer: Self-pay | Admitting: Urology

## 2023-04-09 ENCOUNTER — Emergency Department (HOSPITAL_BASED_OUTPATIENT_CLINIC_OR_DEPARTMENT_OTHER): Payer: Medicare Other

## 2023-04-09 DIAGNOSIS — Z8673 Personal history of transient ischemic attack (TIA), and cerebral infarction without residual deficits: Secondary | ICD-10-CM | POA: Insufficient documentation

## 2023-04-09 DIAGNOSIS — J101 Influenza due to other identified influenza virus with other respiratory manifestations: Secondary | ICD-10-CM | POA: Insufficient documentation

## 2023-04-09 DIAGNOSIS — Z794 Long term (current) use of insulin: Secondary | ICD-10-CM | POA: Insufficient documentation

## 2023-04-09 DIAGNOSIS — I10 Essential (primary) hypertension: Secondary | ICD-10-CM | POA: Diagnosis not present

## 2023-04-09 DIAGNOSIS — E1165 Type 2 diabetes mellitus with hyperglycemia: Secondary | ICD-10-CM | POA: Insufficient documentation

## 2023-04-09 DIAGNOSIS — Z7982 Long term (current) use of aspirin: Secondary | ICD-10-CM | POA: Insufficient documentation

## 2023-04-09 DIAGNOSIS — R42 Dizziness and giddiness: Secondary | ICD-10-CM | POA: Diagnosis present

## 2023-04-09 LAB — BASIC METABOLIC PANEL
Anion gap: 10 (ref 5–15)
BUN: 14 mg/dL (ref 8–23)
CO2: 24 mmol/L (ref 22–32)
Calcium: 8.9 mg/dL (ref 8.9–10.3)
Chloride: 104 mmol/L (ref 98–111)
Creatinine, Ser: 0.88 mg/dL (ref 0.61–1.24)
GFR, Estimated: >60 mL/min (ref >60)
Glucose, Bld: 258 mg/dL — ABNORMAL HIGH (ref 70–99)
Potassium: 4.1 mmol/L (ref 3.5–5.1)
Sodium: 138 mmol/L (ref 135–145)

## 2023-04-09 LAB — CBC WITH DIFFERENTIAL/PLATELET
Abs Immature Granulocytes: 0.01 10*3/uL (ref 0.00–0.07)
Basophils Absolute: 0 10*3/uL (ref 0.0–0.1)
Basophils Relative: 1 %
Eosinophils Absolute: 0.1 10*3/uL (ref 0.0–0.5)
Eosinophils Relative: 4 %
HCT: 34.2 % — ABNORMAL LOW (ref 39.0–52.0)
Hemoglobin: 11.1 g/dL — ABNORMAL LOW (ref 13.0–17.0)
Immature Granulocytes: 0 %
Lymphocytes Relative: 19 %
Lymphs Abs: 0.6 10*3/uL — ABNORMAL LOW (ref 0.7–4.0)
MCH: 30.7 pg (ref 26.0–34.0)
MCHC: 32.5 g/dL (ref 30.0–36.0)
MCV: 94.5 fL (ref 80.0–100.0)
Monocytes Absolute: 0.3 10*3/uL (ref 0.1–1.0)
Monocytes Relative: 10 %
Neutro Abs: 2.1 10*3/uL (ref 1.7–7.7)
Neutrophils Relative %: 66 %
Platelets: 168 10*3/uL (ref 150–400)
RBC: 3.62 MIL/uL — ABNORMAL LOW (ref 4.22–5.81)
RDW: 14.9 % (ref 11.5–15.5)
WBC: 3.1 10*3/uL — ABNORMAL LOW (ref 4.0–10.5)
nRBC: 0 % (ref 0.0–0.2)

## 2023-04-09 LAB — HEPATIC FUNCTION PANEL
ALT: 14 U/L (ref 0–44)
AST: 24 U/L (ref 15–41)
Albumin: 3.5 g/dL (ref 3.5–5.0)
Alkaline Phosphatase: 40 U/L (ref 38–126)
Bilirubin, Direct: 0.1 mg/dL (ref 0.0–0.2)
Indirect Bilirubin: 0.4 mg/dL (ref 0.3–0.9)
Total Bilirubin: 0.5 mg/dL (ref 0.0–1.2)
Total Protein: 6.9 g/dL (ref 6.5–8.1)

## 2023-04-09 LAB — CBG MONITORING, ED: Glucose-Capillary: 252 mg/dL — ABNORMAL HIGH (ref 70–99)

## 2023-04-09 LAB — RESP PANEL BY RT-PCR (RSV, FLU A&B, COVID)  RVPGX2
Influenza A by PCR: POSITIVE — AB
Influenza B by PCR: NEGATIVE
Resp Syncytial Virus by PCR: NEGATIVE
SARS Coronavirus 2 by RT PCR: NEGATIVE

## 2023-04-09 MED ORDER — OSELTAMIVIR PHOSPHATE 30 MG PO CAPS: 30.0000 mg | ORAL_CAPSULE | Freq: Two times a day (BID) | ORAL | 0 refills | Status: DC

## 2023-04-09 MED ORDER — BENZONATATE 100 MG PO CAPS: 100.0000 mg | ORAL_CAPSULE | Freq: Three times a day (TID) | ORAL | 0 refills | Status: DC

## 2023-04-09 NOTE — ED Provider Notes (Signed)
Jeffrey Stevens Provider Note   CSN: [id number] Arrival date & time: 04/09/23  1347     History  Chief Complaint  Patient presents with   Dizziness    Jeffrey Stevens is a 73 y.o. male.  Patient with history of diabetes, hypertension, hyperlipidemia presents today with complaints of weakness. He states that same began in the afternoon on Thursday when he got home from work and has been persistent since then. He states that he was able to perform his job on Thursday without issue. He drives a fork lift. When he got home Thursday he started feeling unsteady. States he has been walking with assistance of the wall to get around. Notes that his vision is more blurry as well. He also has had a frontal headache since this morning that is unusual for him. He denies any room spinning sensation or disequilibrium.  No diplopia.  States that his only symptom when he is laying down he has some blurred vision. States when he is walking 'if feels like I just can't get my body to do what I want it to do.' He also notes that he has had a cough for the past few weeks. This has not changed in the past few days. Family at bedside state that the patient has been 'slower' in the past few months, will have intervals of mild confusion as well. Notes he has had some weight loss in the past few months which he has attributed to a decreased appetite.  His primary care doctor has seen and evaluated him for this and has not found an etiology of the symptoms.  They have started him on an appetite stimulant.  He does note that he does continue to smoke daily. He has not fallen.   The history is provided by the patient. No language interpreter was used.  Dizziness Associated symptoms: headaches        Home Medications Prior to Admission medications   Medication Sig Start Date End Date Taking? Authorizing Provider  ACCU-CHEK SOFTCLIX LANCETS lancets  09/16/13   [provider]  acetaminophen (TYLENOL) 500 MG tablet Take 500-1,000 mg by mouth every 4 (four) hours as needed for headache or fever (pain). Patient not taking: Reported on 02/03/2023    [provider]  aspirin EC 81 MG tablet Take 81 mg by mouth daily. Swallow whole.    [provider]  BD PEN NEEDLE NANO U/F 32G X 4 MM MISC See admin instructions. 04/09/15   [provider]  Budeson-Glycopyrrol-Formoterol (BREZTRI AEROSPHERE) 160-9-4.8 MCG/ACT AERO Inhale 2 puffs into the lungs at bedtime.    [provider]  ferrous sulfate 325 (65 FE) MG tablet Take 325 mg by mouth daily with breakfast.    [provider]  insulin glargine, 1 Unit Dial, (TOUJEO SOLOSTAR) 300 UNIT/ML Solostar Pen Inject 30 Units into the skin at bedtime.    [provider]  lisinopril-hydrochlorothiazide (PRINZIDE,ZESTORETIC) 20-12.5 MG per tablet Take 1 tablet by mouth daily. 06/01/14   Richarda Overlie, MD  metoCLOPramide (REGLAN) 10 MG tablet Take 1 tablet (10 mg total) by mouth every 6 (six) hours as needed for nausea (nausea/headache). 03/28/20   Charlynne Pander, MD  Multiple Vitamin (MULTIVITAMIN WITH MINERALS) TABS tablet Take 1 tablet by mouth daily.    [provider]  ONE TOUCH ULTRA TEST test strip  09/16/13   [provider]  rosuvastatin (CRESTOR) 10 MG tablet Take 10 mg by  mouth daily. 06/16/17   [provider]  Saxagliptin-Metformin 2.06-998 MG TB24 Take 2 tablets by mouth daily with supper. Kombiglyze    [provider]      Allergies    Patient has no known allergies.    Review of Systems   Review of Systems  Respiratory:  Positive for cough.   Neurological:  Positive for headaches.  All other systems reviewed and are negative.   Physical Exam Updated Vital Signs BP (!) 159/74 (BP Location: Right Arm)   Pulse 87   Temp 97.8 F (36.6 C)   Resp 18   Ht 5\' 9"  (1.753 m)   Wt 55.8 kg   SpO2 99%   BMI 18.17 kg/m   Physical Exam Vitals and nursing note reviewed.  Constitutional:      General: He is not in acute distress.    Appearance: Normal appearance. He is normal weight. He is not ill-appearing, toxic-appearing or diaphoretic.  HENT:     Head: Normocephalic and atraumatic.  Eyes:     Extraocular Movements: Extraocular movements intact.     Pupils: Pupils are equal, round, and reactive to light.  Cardiovascular:     Rate and Rhythm: Normal rate and regular rhythm.  Pulmonary:     Effort: Pulmonary effort is normal. No respiratory distress.     Breath sounds: Normal breath sounds.  Abdominal:     General: Abdomen is flat.     Palpations: Abdomen is soft.     Tenderness: There is no abdominal tenderness.  Musculoskeletal:        General: Normal range of motion.     Cervical back: Normal range of motion.     Right lower leg: No edema.     Left lower leg: No edema.  Skin:    General: Skin is warm and dry.  Neurological:     General: No focal deficit present.     Mental Status: He is alert and oriented to person, place, and time.     GCS: GCS eye subscore is 4. GCS verbal subscore is 5. GCS motor subscore is 6.     Sensory: Sensation is intact.     Motor: Motor function is intact.     Coordination: Coordination is intact.     Gait: Gait is intact.     Comments: Alert and oriented to self, place, time and event.    Speech is fluent, clear without dysarthria or dysphasia.    Strength 5/5 in upper/lower extremities   Sensation intact in upper/lower extremities   Able to ambulate without assistance with a slow shuffling gait  Normal finger-to-nose testing   CN I not tested  CN II grossly intact visual fields bilaterally. Did not visualize posterior eye.  CN III, IV, VI PERRLA and EOMs intact bilaterally  CN V Intact sensation to sharp and light touch to the face  CN VII facial movements symmetric  CN VIII not tested  CN IX, X no uvula deviation, symmetric rise of soft palate  CN  XI 5/5 SCM and trapezius strength bilaterally  CN XII Midline tongue protrusion, symmetric L/R movements   Psychiatric:        Mood and Affect: Mood normal.        Behavior: Behavior normal.     ED Results / Procedures / Treatments   Labs (all labs ordered are listed, but only abnormal results are displayed) Labs Reviewed  CBC WITH DIFFERENTIAL/PLATELET - Abnormal; Notable for the following components:  Result Value   WBC 3.1 (*)    RBC 3.62 (*)    Hemoglobin 11.1 (*)    HCT 34.2 (*)    Lymphs Abs 0.6 (*)    All other components within normal limits  BASIC METABOLIC PANEL - Abnormal; Notable for the following components:   Glucose, Bld 258 (*)    All other components within normal limits  CBG MONITORING, ED - Abnormal; Notable for the following components:   Glucose-Capillary 252 (*)    All other components within normal limits  RESP PANEL BY RT-PCR (RSV, FLU A&B, COVID)  [id number]  URINALYSIS, ROUTINE W REFLEX MICROSCOPIC  HEPATIC FUNCTION PANEL    EKG None  Radiology DG Chest Portable 1 View Result Date: 04/09/2023 CLINICAL DATA:  Cough.  Dizziness.  Blurry vision. EXAM: PORTABLE CHEST 1 VIEW COMPARISON:  08/07/2019. FINDINGS: Low lung volume. Bilateral lung fields are clear. Bilateral costophrenic angles are clear. Normal cardio-mediastinal silhouette. No acute osseous abnormalities. The soft tissues are within normal limits. IMPRESSION: No active disease. Electronically Signed   By: Jules Schick M.D.   On: 04/09/2023 17:13   CT Head Wo Contrast Result Date: 04/09/2023 CLINICAL DATA:  Headache.  Dizziness and blurry vision. EXAM: CT HEAD WITHOUT CONTRAST TECHNIQUE: Contiguous axial images were obtained from the base of the skull through the vertex without intravenous contrast. RADIATION DOSE REDUCTION: This exam was performed according to the departmental dose-optimization program which includes automated exposure control, adjustment of the mA and/or kV according to  patient size and/or use of iterative reconstruction technique. COMPARISON:  05/11/2022 FINDINGS: Brain: No evidence of acute infarction, hemorrhage, hydrocephalus, extra-axial collection or mass lesion/mass effect. Remote infarct in the left cerebellar hemisphere There is moderate diffuse low-attenuation within the subcortical and periventricular white matter compatible with chronic microvascular disease. Prominence of the sulci and ventricles compatible with brain atrophy. Vascular: No hyperdense vessel or unexpected calcification. Skull: Normal. Negative for fracture or focal lesion. Sinuses/Orbits: Mild fluid identified within the dependent portion of the right maxillary sinus. The remaining sinuses are clear. Other: None IMPRESSION: 1. No acute intracranial abnormalities. 2. Chronic microvascular disease and brain atrophy. 3. Remote infarct in the left cerebellar hemisphere. Electronically Signed   By: Signa Kell M.D.   On: 04/09/2023 16:10    Procedures Procedures    Medications Ordered in ED Medications - No data to display  ED Course/ Medical Decision Making/ A&P Clinical Course as of 04/09/23 1722  Sun Apr 09, 2023  1655 DG Chest Portable 1 View Clear. Dizzy acute on chronic. Eval at bedside [CC]    Clinical Course User Index [CC] Glyn Ade, MD                                 Medical Decision Making Amount and/or Complexity of Data Reviewed Labs: ordered. Radiology: ordered. Decision-making details documented in ED Course.  Risk Prescription drug management.   This patient is a 73 y.o. male who presents to the ED for concern of weakness, this involves an extensive number of treatment options, and is a complaint that carries with it a high risk of complications and morbidity. The emergent differential diagnosis prior to evaluation includes, but is not limited to,  CVA, arrhythmia, orthostatic hypotension, sepsis, hypoglycemia, hypoxia, electrolyte disturbance,  endocrine disorder, anemia, environmental exposure, polypharmacy   This is not an exhaustive differential.   Past Medical History / Co-morbidities / Social History:  has a  past medical history of Diabetes mellitus without complication (HCC), Dyslipidemia, Hypertension, and Pneumonia (05/23/2014).  Additional history: Chart reviewed. Pertinent results include: Patient had CT chest for lung cancer screening on 03/19/2023 that was normal.  Physical Exam: Physical exam performed. The pertinent findings include: Alert and oriented and neurologically intact without focal deficits.  Able to ambulate with a slow gait but without assistance  Lab Tests: I ordered, and personally interpreted labs.  The pertinent results include:  Flu + WBC 3.1 likely due to the flu. Hgb 11.1, glucose 258   Imaging Studies: I ordered imaging studies including CT head, CXR. I independently visualized and interpreted imaging which showed   CT head:  1. No acute intracranial abnormalities. 2. Chronic microvascular disease and brain atrophy. 3. Remote infarct in the left cerebellar hemisphere.  CXR: NAD  I agree with the radiologist interpretation.   Cardiac Monitoring:  The patient was maintained on a cardiac monitor.  Cardiac monitor showed an underlying rhythm of: sinus rhythm. I agree with this interpretation.  Disposition: After consideration of the diagnostic results and the patients response to treatment, I feel that emergency department workup does not suggest an emergent condition requiring admission or immediate intervention beyond what has been performed at this time. The plan is: Discharge with Tamiflu and Tessalon for cough with close outpatient follow-up and return precautions. Suspect influenza has exacerbated patients chronic cognitive decline. He does also have a remote cerebellar infarct on CT which also could have precipitated his decline in the past few months. Work-up otherwise benign, he is able  to ambulate without assistance or unsteadiness.  Therefore no indication for further evaluation with MRI or admission at this time.  He is not short of breath and is speaking in complete sentences on room air. He wants to go home.  Recommend he follow-up closely with his primary care doctor. Evaluation and diagnostic testing in the emergency department does not suggest an emergent condition requiring admission or immediate intervention beyond what has been performed at this time.  Plan for discharge with close PCP follow-up.  Patient is understanding and amenable with plan, educated on red flag symptoms that would prompt immediate return.  Patient discharged in stable condition.   I discussed this case with my attending physician Dr. Doran Durand who cosigned this note including patient's presenting symptoms, physical exam, and planned diagnostics and interventions. Attending physician stated agreement with plan or made changes to plan which were implemented.    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     Silva Bandy, PA-C 04/09/23 2018    Sloan Leiter, DO 04/10/23 [phone]

## 2023-04-09 NOTE — Discharge Instructions (Addendum)
As we discussed, your workup in the ER today was reassuring for acute findings.  You did test positive for influenza which is likely the cause of your symptoms.  For this, I have given you a prescription for Tamiflu which you need to take as prescribed in its entirety for management of your symptoms.  I have also given you a prescription for Tessalon which is a cough suppressant medication you can take as prescribed as needed.  Additionally, CT imaging of your head did show that you have had a stroke at some point in the past but is hard to say exactly when.  This could have been contributing to your general deconditioning recently.  I recommend that you follow-up closely with your primary care doctor to discuss a plan moving forward with this.  I expect that having the flu has made these symptoms more apparent.   I recommend that you get plenty of rest and focus on symptomatic relief which includes Cepacol throat lozenges for sore throat, Mucinex for congestion, and tylenol/ibuprofen as needed for fevers and bodyaches. I also recommend:  Increased fluid intake. Sports drinks offer valuable electrolytes, sugars, and fluids.  Breathing heated mist or steam (vaporizer or shower).  Eating chicken soup or other clear broths, and maintaining good nutrition.   Increasing usage of your inhaler if you have asthma.  Return to work when your temperature has returned to normal.  Gargle warm salt water and spit it out for sore throat. Take benadryl or Zyrtec to decrease sinus secretions.  Follow Up: Follow up with your primary care doctor in 5-7 days for recheck of ongoing symptoms.  Return to emergency department for emergent changing or worsening of symptoms.

## 2023-04-09 NOTE — ED Triage Notes (Signed)
Pt states dizziness and blurry vision since Thursday Does take BP meds  Denies N/V/D/Fever

## 2023-05-04 ENCOUNTER — Other Ambulatory Visit: Payer: Self-pay

## 2023-05-04 ENCOUNTER — Emergency Department (HOSPITAL_BASED_OUTPATIENT_CLINIC_OR_DEPARTMENT_OTHER)

## 2023-05-04 ENCOUNTER — Observation Stay (HOSPITAL_BASED_OUTPATIENT_CLINIC_OR_DEPARTMENT_OTHER)
Admission: EM | Admit: 2023-05-04 | Discharge: 2023-05-06 | Disposition: A | Discharge status: Home / Self Care | Attending: Internal Medicine | Admitting: Internal Medicine

## 2023-05-04 DIAGNOSIS — R270 Ataxia, unspecified: Secondary | ICD-10-CM

## 2023-05-04 DIAGNOSIS — E785 Hyperlipidemia, unspecified: Secondary | ICD-10-CM | POA: Diagnosis not present

## 2023-05-04 DIAGNOSIS — Z7984 Long term (current) use of oral hypoglycemic drugs: Secondary | ICD-10-CM | POA: Insufficient documentation

## 2023-05-04 DIAGNOSIS — Z79899 Other long term (current) drug therapy: Secondary | ICD-10-CM | POA: Insufficient documentation

## 2023-05-04 DIAGNOSIS — Z794 Long term (current) use of insulin: Secondary | ICD-10-CM | POA: Diagnosis not present

## 2023-05-04 DIAGNOSIS — I639 Cerebral infarction, unspecified: Principal | ICD-10-CM

## 2023-05-04 DIAGNOSIS — F17210 Nicotine dependence, cigarettes, uncomplicated: Secondary | ICD-10-CM | POA: Insufficient documentation

## 2023-05-04 DIAGNOSIS — R4182 Altered mental status, unspecified: Secondary | ICD-10-CM | POA: Diagnosis present

## 2023-05-04 DIAGNOSIS — R109 Unspecified abdominal pain: Secondary | ICD-10-CM | POA: Diagnosis not present

## 2023-05-04 DIAGNOSIS — R410 Disorientation, unspecified: Secondary | ICD-10-CM

## 2023-05-04 DIAGNOSIS — E119 Type 2 diabetes mellitus without complications: Secondary | ICD-10-CM | POA: Diagnosis not present

## 2023-05-04 DIAGNOSIS — D649 Anemia, unspecified: Secondary | ICD-10-CM | POA: Diagnosis not present

## 2023-05-04 DIAGNOSIS — R29818 Other symptoms and signs involving the nervous system: Secondary | ICD-10-CM | POA: Insufficient documentation

## 2023-05-04 DIAGNOSIS — Z7982 Long term (current) use of aspirin: Secondary | ICD-10-CM | POA: Insufficient documentation

## 2023-05-04 DIAGNOSIS — R2689 Other abnormalities of gait and mobility: Secondary | ICD-10-CM | POA: Insufficient documentation

## 2023-05-04 DIAGNOSIS — I10 Essential (primary) hypertension: Secondary | ICD-10-CM | POA: Diagnosis not present

## 2023-05-04 LAB — COMPREHENSIVE METABOLIC PANEL
ALT: 14 U/L (ref 0–44)
ALT: 13 U/L (ref 0–44)
AST: 15 U/L (ref 15–41)
AST: 14 U/L — ABNORMAL LOW (ref 15–41)
Albumin: 3.6 g/dL (ref 3.5–5.0)
Albumin: 3.4 g/dL — ABNORMAL LOW (ref 3.5–5.0)
Alkaline Phosphatase: 37 U/L — ABNORMAL LOW (ref 38–126)
Alkaline Phosphatase: 36 U/L — ABNORMAL LOW (ref 38–126)
Anion gap: 11 (ref 5–15)
Anion gap: 7 (ref 5–15)
BUN: 22 mg/dL (ref 8–23)
BUN: 22 mg/dL (ref 8–23)
CO2: 22 mmol/L (ref 22–32)
CO2: 25 mmol/L (ref 22–32)
Calcium: 9.4 mg/dL (ref 8.9–10.3)
Calcium: 9 mg/dL (ref 8.9–10.3)
Chloride: 108 mmol/L (ref 98–111)
Chloride: 109 mmol/L (ref 98–111)
Creatinine, Ser: 1.06 mg/dL (ref 0.61–1.24)
Creatinine, Ser: 0.96 mg/dL (ref 0.61–1.24)
GFR, Estimated: >60 mL/min (ref >60)
GFR, Estimated: >60 mL/min (ref >60)
Glucose, Bld: 156 mg/dL — ABNORMAL HIGH (ref 70–99)
Glucose, Bld: 164 mg/dL — ABNORMAL HIGH (ref 70–99)
Potassium: 3.6 mmol/L (ref 3.5–5.1)
Potassium: 3.5 mmol/L (ref 3.5–5.1)
Sodium: 141 mmol/L (ref 135–145)
Sodium: 141 mmol/L (ref 135–145)
Total Bilirubin: 0.8 mg/dL (ref 0.0–1.2)
Total Bilirubin: 0.6 mg/dL (ref 0.0–1.2)
Total Protein: 6.9 g/dL (ref 6.5–8.1)
Total Protein: 6.4 g/dL — ABNORMAL LOW (ref 6.5–8.1)

## 2023-05-04 LAB — CBC
HCT: 32.5 % — ABNORMAL LOW (ref 39.0–52.0)
Hemoglobin: 10.9 g/dL — ABNORMAL LOW (ref 13.0–17.0)
MCH: 31.6 pg (ref 26.0–34.0)
MCHC: 33.5 g/dL (ref 30.0–36.0)
MCV: 94.2 fL (ref 80.0–100.0)
Platelets: 166 10*3/uL (ref 150–400)
RBC: 3.45 MIL/uL — ABNORMAL LOW (ref 4.22–5.81)
RDW: 15.1 % (ref 11.5–15.5)
WBC: 6.3 10*3/uL (ref 4.0–10.5)
nRBC: 0 % (ref 0.0–0.2)

## 2023-05-04 LAB — DIFFERENTIAL
Abs Immature Granulocytes: 0.02 10*3/uL (ref 0.00–0.07)
Basophils Absolute: 0.1 10*3/uL (ref 0.0–0.1)
Basophils Relative: 1 %
Eosinophils Absolute: 0.1 10*3/uL (ref 0.0–0.5)
Eosinophils Relative: 2 %
Immature Granulocytes: 0 %
Lymphocytes Relative: 21 %
Lymphs Abs: 1.3 10*3/uL (ref 0.7–4.0)
Monocytes Absolute: 0.5 10*3/uL (ref 0.1–1.0)
Monocytes Relative: 8 %
Neutro Abs: 4.4 10*3/uL (ref 1.7–7.7)
Neutrophils Relative %: 68 %

## 2023-05-04 LAB — APTT: aPTT: 27 s (ref 24–36)

## 2023-05-04 LAB — PROTIME-INR
INR: 1.1 (ref 0.8–1.2)
Prothrombin Time: 14.7 s (ref 11.4–15.2)

## 2023-05-04 LAB — CBG MONITORING, ED: Glucose-Capillary: 153 mg/dL — ABNORMAL HIGH (ref 70–99)

## 2023-05-04 LAB — ETHANOL: Alcohol, Ethyl (B): <10 mg/dL (ref <10)

## 2023-05-04 MED ORDER — IOHEXOL 350 MG/ML SOLN
75.0000 mL | Freq: Once | INTRAVENOUS | Status: AC | PRN
  Administered 2023-05-04: 75 mL via INTRAVENOUS

## 2023-05-04 MED ORDER — CLOPIDOGREL BISULFATE 300 MG PO TABS
300.0000 mg | ORAL_TABLET | Freq: Once | ORAL | Status: AC
  Administered 2023-05-04: 300 mg via ORAL
  Filled 2023-05-04: qty 1

## 2023-05-04 NOTE — Consult Note (Signed)
 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 Currently, patient is back to baseline. He has some drift in the BLE on exam. CT Head and CTA negative for acute findings. Concern for possible TIA v toxic/metabolic etiologies. Plan to admit for MRI Brain wo and infectious/metabolic workup. Load with Plavix 300 mg in the ED and start DAPT tomorrow. Permissive HTN.  Our recommendations are outlined below.  Recommendations:        Stroke/Telemetry Floor       Neuro Checks       Bedside Swallow Eval       DVT Prophylaxis       IV Fluids, Normal Saline       Head of Bed 30 Degrees       Euglycemia and Avoid Hyperthermia (PRN Acetaminophen)       Bolus with Clopidogrel 300 mg bolus x1 and initiate dual antiplatelet therapy with Aspirin 81 mg daily and Clopidogrel 75 mg daily       Antihypertensives PRN if Blood pressure is greater than 220/120 or there is a concern for End organ damage/contraindications for permissive HTN. If blood pressure is greater than 220/120 give labetalol PO or IV or Vasotec IV with a goal of 15% reduction in BP during the first 24 hours.  Sign Out:       Discussed with Emergency Department Provider    ------------------------------------------------------------------------------  Advanced Imaging: CTA Head and Neck Completed.  LVO:No  Patient in not a candidate for NIR   Metrics: Last Known Well: 05/04/2023 18:30:00 Dispatch Time: 05/04/2023 21:36:40 Arrival Time: 05/04/2023 20:31:00 Initial  Response Time: 05/04/2023 21:42:09 Symptoms: off balance, confusion. Initial patient interaction: 05/04/2023 21:43:54 NIHSS Assessment Completed: 05/04/2023 21:49:23 Patient is not a candidate for Thrombolytic. Thrombolytic Medical Decision: 05/04/2023 22:14:38 Patient was not deemed candidate for Thrombolytic because of following reasons: Resolved symptoms .  CT head showed no acute hemorrhage or acute core infarct.  Primary Provider Notified of Diagnostic Impression and Management Plan on: 05/04/2023 22:24:28    ------------------------------------------------------------------------------  History of Present Illness: Patient is a 73 year old Male.  Patient was brought by private transportation with symptoms of off balance, confusion. Patient is accompanied by his daughter and roommate. Patient got home from work and laid down. His friend from work came by and everything was fine. At ~1830 he was eating dinner and watching TV, shortly after this he started going to the bathroom on the coffee table. After this he became really confused and falling over, needed help to keep him up. Daughter was trying to talk to him and he didn't know who she was. He was a little combative and confused. He was unable to sit up on the side of the bed. He does have chronic balance issues at baseline.    Past Medical History:      Hypertension      Diabetes Mellitus      Hyperlipidemia      Stroke  Medications:  No Anticoagulant use  Antiplatelet use: Yes ASA 81  mg Reviewed EMR for current medications  Allergies:  Reviewed  Social History: Smoking: Yes  Family History:  There is no family history of premature cerebrovascular disease pertinent to this consultation  ROS : 14 Points Review of Systems was performed and was negative except mentioned in HPI.  Past Surgical History: There Is No Surgical History Contributory To Today's Visit     Examination: BP(122/73), Pulse(60), Blood  Glucose(153) 1A: Level of Consciousness - Alert; keenly responsive + 0 1B: Ask Month and Age - Both Questions Right + 0 1C: Blink Eyes & Squeeze Hands - Performs Both Tasks + 0 2: Test Horizontal Extraocular Movements - Normal + 0 3: Test Visual Fields - No Visual Loss + 0 4: Test Facial Palsy (Use Grimace if Obtunded) - Normal symmetry + 0 5A: Test Left Arm Motor Drift - No Drift for 10 Seconds + 0 5B: Test Right Arm Motor Drift - No Drift for 10 Seconds + 0 6A: Test Left Leg Motor Drift - Drift, but doesn't hit bed + 1 6B: Test Right Leg Motor Drift - Drift, but doesn't hit bed + 1 7: Test Limb Ataxia (FNF/Heel-Shin) - No Ataxia + 0 8: Test Sensation - Normal; No sensory loss + 0 9: Test Language/Aphasia - Normal; No aphasia + 0 10: Test Dysarthria - Normal + 0 11: Test Extinction/Inattention - No abnormality + 0  NIHSS Score: 2   Pre-Morbid Modified Rankin Scale: 0 Points = No symptoms at all  Spoke with : Dr. Deretha Emory  This consult was conducted in real time using interactive audio and Immunologist. Patient was informed of the technology being used for this visit and agreed to proceed. Patient located in hospital and provider located at home/office setting.   Patient is being evaluated for possible acute neurologic impairment and high probability of imminent or life-threatening deterioration. I spent total of 45 minutes providing care to this patient, including time for face to face visit via telemedicine, review of medical records, imaging studies and discussion of findings with providers, the patient and/or family.   Dr Harrie Jeans   [facility] For Inpatient follow-up with [facility] physician please call [facility] at [phone]. As we are not an outpatient service for any post hospital discharge needs please contact the hospital for assistance. If you have any questions for the [facility] physicians or need to reconsult for clinical or diagnostic  changes please contact us via [facility] at [phone].

## 2023-05-04 NOTE — ED Notes (Signed)
 At CT

## 2023-05-04 NOTE — ED Triage Notes (Addendum)
 Pt POV in wheelchair with daughter and roommate- daughter reports sudden memory loss, pt stated he had to use bathroom, did not go to bathroom, did not know daughters name, refused assistance with ADL's which is unusual.   LKW 1900. BEFAST neg.  Pt still works full time at baseline.  Not answering questions appropriately in triage.

## 2023-05-04 NOTE — ED Notes (Signed)
 Tele Neuro RN in room at this time

## 2023-05-04 NOTE — ED Provider Notes (Signed)
 [facility] Provider Note   CSN: [id number] Arrival date & time: 05/04/23  2031     History  Chief Complaint  Patient presents with   Altered Mental Status    Jeffrey Stevens is a 73 y.o. male.  Patient still works.  Patient came from home work had some visitors.  Normally does take a nap when he first comes home and he did do that that is unusual.  At about 1830 while he was eating dinner watching TV he suddenly got very confused he started to go #1 and #2 bathroom at actually in the living room.  Patient significant other try to get him back to the bathroom he was resistant to do that and seem to be off balance with walking.  Contacted his daughter daughter came they were struggling to try to clean him up even had difficulty getting him on the bed.  But was moving all extremities.  He seemed to be very confused did not recognize his daughter.  Patient supposedly has CT evidence of an old stroke.  But not known to have had a stroke.  Patient was brought in by wheelchair.  Patient right now denies any headache speech seems to be okay right now.  Did not recognize his significant other.  Denied chest pain denied any pain.  Motor strength lower extremities upper extremities was fine.  Extraocular muscles were intact.  Did not walk him.  Past medical history sniffing for diabetes hypertension hyperlipidemia patient is an everyday smoker.  Did not have any bowel meaning did not have any diarrhea.  Vital signs here was 97.7 pulse 76 respirations 18 blood pressure 136/60 oxygen saturation 99%.  Glucose was 150.       Home Medications Prior to Admission medications   Medication Sig Start Date End Date Taking? Authorizing Provider  ACCU-CHEK SOFTCLIX LANCETS lancets  09/16/13   [provider]  acetaminophen (TYLENOL) 500 MG tablet Take 500-1,000 mg by mouth every 4 (four) hours as needed for headache or fever (pain). Patient not taking:  Reported on 02/03/2023    [provider]  aspirin EC 81 MG tablet Take 81 mg by mouth daily. Swallow whole.    [provider]  BD PEN NEEDLE NANO U/F 32G X 4 MM MISC See admin instructions. 04/09/15   [provider]  benzonatate (TESSALON) 100 MG capsule Take 1 capsule (100 mg total) by mouth every 8 (eight) hours. 04/09/23   Smoot, Shawn Route, PA-C  Budeson-Glycopyrrol-Formoterol (BREZTRI AEROSPHERE) 160-9-4.8 MCG/ACT AERO Inhale 2 puffs into the lungs at bedtime.    [provider]  ferrous sulfate 325 (65 FE) MG tablet Take 325 mg by mouth daily with breakfast.    [provider]  insulin glargine, 1 Unit Dial, (TOUJEO SOLOSTAR) 300 UNIT/ML Solostar Pen Inject 30 Units into the skin at bedtime.    [provider]  lisinopril-hydrochlorothiazide (PRINZIDE,ZESTORETIC) 20-12.5 MG per tablet Take 1 tablet by mouth daily. 06/01/14   Richarda Overlie, MD  metoCLOPramide (REGLAN) 10 MG tablet Take 1 tablet (10 mg total) by mouth every 6 (six) hours as needed for nausea (nausea/headache). 03/28/20   Charlynne Pander, MD  Multiple Vitamin (MULTIVITAMIN WITH MINERALS) TABS tablet Take 1 tablet by mouth daily.    [provider]  ONE TOUCH ULTRA TEST test strip  09/16/13   [provider]  oseltamivir (TAMIFLU) 30 MG capsule Take 1 capsule (30 mg total) by mouth 2 (two)  times daily. 04/09/23   Smoot, Sarah A, PA-C  rosuvastatin (CRESTOR) 10 MG tablet Take 10 mg by mouth daily. 06/16/17   [provider]  Saxagliptin-Metformin 2.06-998 MG TB24 Take 2 tablets by mouth daily with supper. Kombiglyze    [provider]      Allergies    Patient has no known allergies.    Review of Systems   Review of Systems  Constitutional:  Negative for chills and fever.  HENT:  Negative for ear pain and sore throat.   Eyes:  Negative for pain and visual disturbance.  Respiratory:  Negative for cough and shortness of breath.    Cardiovascular:  Negative for chest pain and palpitations.  Gastrointestinal:  Negative for abdominal pain and vomiting.  Genitourinary:  Negative for dysuria and hematuria.  Musculoskeletal:  Negative for arthralgias and back pain.  Skin:  Negative for color change and rash.  Neurological:  Negative for dizziness, seizures, syncope, facial asymmetry, weakness, numbness and headaches.  Psychiatric/Behavioral:  Positive for confusion.   All other systems reviewed and are negative.   Physical Exam Updated Vital Signs BP 122/73 (BP Location: Right Arm)   Pulse 62   Temp 97.7 F (36.5 C)   Resp 20   Wt 54.2 kg   SpO2 98%   BMI 17.65 kg/m  Physical Exam Vitals and nursing note reviewed.  Constitutional:      General: He is not in acute distress.    Appearance: Normal appearance. He is well-developed.  HENT:     Head: Normocephalic and atraumatic.     Mouth/Throat:     Mouth: Mucous membranes are moist.  Eyes:     Extraocular Movements: Extraocular movements intact.     Conjunctiva/sclera: Conjunctivae normal.     Pupils: Pupils are equal, round, and reactive to light.  Cardiovascular:     Rate and Rhythm: Normal rate and regular rhythm.     Heart sounds: No murmur heard. Pulmonary:     Effort: Pulmonary effort is normal. No respiratory distress.     Breath sounds: Normal breath sounds.  Abdominal:     Palpations: Abdomen is soft.     Tenderness: There is no abdominal tenderness.  Musculoskeletal:        General: No swelling.     Cervical back: Normal range of motion and neck supple. No rigidity.  Skin:    General: Skin is warm and dry.     Capillary Refill: Capillary refill takes less than 2 seconds.  Neurological:     Mental Status: He is alert.     Cranial Nerves: No cranial nerve deficit.     Sensory: No sensory deficit.     Motor: No weakness.     Comments: Confused but will follow commands but little bit slow to follow them.  Psychiatric:        Mood and  Affect: Mood normal.     ED Results / Procedures / Treatments   Labs (all labs ordered are listed, but only abnormal results are displayed) Labs Reviewed  CBC - Abnormal; Notable for the following components:      Result Value   RBC 3.45 (*)    Hemoglobin 10.9 (*)    HCT 32.5 (*)    All other components within normal limits  CBG MONITORING, ED - Abnormal; Notable for the following components:   Glucose-Capillary 153 (*)    All other components within normal limits  COMPREHENSIVE METABOLIC PANEL  ETHANOL  PROTIME-INR  APTT  DIFFERENTIAL  COMPREHENSIVE METABOLIC PANEL  RAPID URINE DRUG SCREEN, HOSP PERFORMED  URINALYSIS, ROUTINE W REFLEX MICROSCOPIC  CBG MONITORING, ED    EKG EKG Interpretation Date/Time:  Thursday May 04 2023 20:45:33 EDT Ventricular Rate:  58 PR Interval:  141 QRS Duration:  82 QT Interval:  428 QTC Calculation: 421 R Axis:   75  Text Interpretation: Sinus rhythm Confirmed by Vanetta Mulders [phone]) on 05/04/2023 9:05:40 PM  Radiology No results found.  Procedures Procedures    Medications Ordered in ED Medications - No data to display  ED Course/ Medical Decision Making/ A&P                                 Medical Decision Making Amount and/or Complexity of Data Reviewed Labs: ordered. Radiology: ordered.   Suspect frontal stroke.  Of activated code stroke.  May not be a candidate for TNK.  Will get teleneurology involved and unfortunately been waiting for ever on CT head results.  CBC white count 6.3 hemoglobin 10.9 platelets 166.  Blood sugar as stated was 153.  EKG without any acute findings.  Without any respiratory symptoms.  Was fine until this event at 1830.  CRITICAL CARE Performed by: Vanetta Mulders Total critical care time: 60 minutes Critical care time was exclusive of separately billable procedures and treating other patients. Critical care was necessary to treat or prevent imminent or life-threatening  deterioration. Critical care was time spent personally by me on the following activities: development of treatment plan with patient and/or surrogate as well as nursing, discussions with consultants, evaluation of patient's response to treatment, examination of patient, obtaining history from patient or surrogate, ordering and performing treatments and interventions, ordering and review of laboratory studies, ordering and review of radiographic studies, pulse oximetry and re-evaluation of patient's condition.  Final Clinical Impression(s) / ED Diagnoses Final diagnoses:  Cerebrovascular accident (CVA), unspecified mechanism (HCC)  Confusion    Rx / DC Orders ED Discharge Orders     None         Vanetta Mulders, MD 05/04/23 2134

## 2023-05-04 NOTE — ED Notes (Signed)
 ED Doctor still has not heard back from Hospitalist  have paged several times and still no response .  ED Doctor still waiting for a call back Paged @ 23:12

## 2023-05-04 NOTE — ED Notes (Signed)
 Dr. Christophe Louis on Tele Neuro with patient

## 2023-05-04 NOTE — ED Notes (Signed)
 Call placed to tele-neurology, no response when attempted to active cart x 2

## 2023-05-04 NOTE — ED Notes (Signed)
 Re paged for Consult to hospitalist several times, ED Doctor aware and waiting for a call back.

## 2023-05-04 NOTE — ED Notes (Signed)
Patient is back in room at this time.

## 2023-05-04 NOTE — Plan of Care (Signed)
 Plan of Care Note for accepted transfer   Patient name: Jeffrey Stevens EAV:[id number] DOB: [birth year]  Facility requesting transfer: [facility] ED Requesting Provider: Dr. Deretha Emory Facility course: 73 year old male with history of diabetes, hypertension, hyperlipidemia, chronic left cerebellar stroke without residual deficits, cigarette smoking presented to the ED for evaluation of sudden onset confusion and gait problem.  CT head and CTA head and neck negative for acute findings.  Teleneurology consulted and there is concern for possible TIA versus toxic/metabolic etiologies.  Recommended admission for brain MRI and further workup.  Neurology recommended loading with 300 mg Plavix and starting DAPT tomorrow, permissive hypertension.  Patient was given Plavix 300 mg in the ED.  Labs showing no leukocytosis, hemoglobin 10.9 (previously 11.1 on 04/09/2023), sodium 141, glucose 164, creatinine 0.96, UA and UDS pending, ethanol level pending.  Plan of care: The patient is accepted for admission to Telemetry unit at [facility].  [facility] will assume care on arrival to accepting facility. Until arrival, care as per EDP. However, [facility] available 24/7 for questions and assistance.  Check [email or web address] for on-call coverage.  Nursing staff, please call [facility] Admits & Consults System-Wide number under Amion on patient's arrival so appropriate admitting provider can evaluate the pt.

## 2023-05-05 ENCOUNTER — Encounter (HOSPITAL_COMMUNITY): Payer: Self-pay | Admitting: Internal Medicine

## 2023-05-05 ENCOUNTER — Observation Stay (HOSPITAL_COMMUNITY)

## 2023-05-05 DIAGNOSIS — Z794 Long term (current) use of insulin: Secondary | ICD-10-CM | POA: Diagnosis not present

## 2023-05-05 DIAGNOSIS — F17210 Nicotine dependence, cigarettes, uncomplicated: Secondary | ICD-10-CM | POA: Diagnosis not present

## 2023-05-05 DIAGNOSIS — Z7984 Long term (current) use of oral hypoglycemic drugs: Secondary | ICD-10-CM | POA: Diagnosis not present

## 2023-05-05 DIAGNOSIS — E119 Type 2 diabetes mellitus without complications: Secondary | ICD-10-CM

## 2023-05-05 DIAGNOSIS — R404 Transient alteration of awareness: Secondary | ICD-10-CM | POA: Diagnosis not present

## 2023-05-05 DIAGNOSIS — R2689 Other abnormalities of gait and mobility: Secondary | ICD-10-CM | POA: Diagnosis not present

## 2023-05-05 DIAGNOSIS — R4182 Altered mental status, unspecified: Secondary | ICD-10-CM | POA: Diagnosis present

## 2023-05-05 DIAGNOSIS — Z79899 Other long term (current) drug therapy: Secondary | ICD-10-CM | POA: Diagnosis not present

## 2023-05-05 DIAGNOSIS — R569 Unspecified convulsions: Secondary | ICD-10-CM | POA: Diagnosis not present

## 2023-05-05 DIAGNOSIS — D649 Anemia, unspecified: Secondary | ICD-10-CM | POA: Diagnosis not present

## 2023-05-05 DIAGNOSIS — I10 Essential (primary) hypertension: Secondary | ICD-10-CM | POA: Diagnosis not present

## 2023-05-05 DIAGNOSIS — E785 Hyperlipidemia, unspecified: Secondary | ICD-10-CM | POA: Diagnosis not present

## 2023-05-05 DIAGNOSIS — Z7982 Long term (current) use of aspirin: Secondary | ICD-10-CM | POA: Diagnosis not present

## 2023-05-05 DIAGNOSIS — R29818 Other symptoms and signs involving the nervous system: Secondary | ICD-10-CM | POA: Diagnosis not present

## 2023-05-05 DIAGNOSIS — R109 Unspecified abdominal pain: Secondary | ICD-10-CM | POA: Diagnosis not present

## 2023-05-05 LAB — URINALYSIS, ROUTINE W REFLEX MICROSCOPIC
Bilirubin Urine: NEGATIVE
Glucose, UA: NEGATIVE mg/dL
Hgb urine dipstick: NEGATIVE
Ketones, ur: NEGATIVE mg/dL
Leukocytes,Ua: NEGATIVE
Nitrite: NEGATIVE
Protein, ur: NEGATIVE mg/dL
Specific Gravity, Urine: 1.025 (ref 1.005–1.030)
pH: 6 (ref 5.0–8.0)

## 2023-05-05 LAB — HEMOGLOBIN A1C
Hgb A1c MFr Bld: 8.1 % — ABNORMAL HIGH (ref 4.8–5.6)
Mean Plasma Glucose: 185.77 mg/dL

## 2023-05-05 LAB — RAPID URINE DRUG SCREEN, HOSP PERFORMED
Amphetamines: NOT DETECTED
Barbiturates: NOT DETECTED
Benzodiazepines: NOT DETECTED
Cocaine: NOT DETECTED
Opiates: NOT DETECTED
Tetrahydrocannabinol: NOT DETECTED

## 2023-05-05 LAB — CBG MONITORING, ED
Glucose-Capillary: 110 mg/dL — ABNORMAL HIGH (ref 70–99)
Glucose-Capillary: 259 mg/dL — ABNORMAL HIGH (ref 70–99)

## 2023-05-05 LAB — GLUCOSE, CAPILLARY
Glucose-Capillary: 107 mg/dL — ABNORMAL HIGH (ref 70–99)
Glucose-Capillary: 369 mg/dL — ABNORMAL HIGH (ref 70–99)

## 2023-05-05 MED ORDER — INSULIN GLARGINE-YFGN 100 UNIT/ML ~~LOC~~ SOLN: 10.0000 [IU] | Freq: Every day | SUBCUTANEOUS | Status: DC

## 2023-05-05 MED ORDER — STROKE: EARLY STAGES OF RECOVERY BOOK
Freq: Once | Status: AC
  Filled 2023-05-05: qty 1

## 2023-05-05 MED ORDER — FLUTICASONE FUROATE-VILANTEROL 200-25 MCG/ACT IN AEPB
1.0000 | INHALATION_SPRAY | Freq: Every day | RESPIRATORY_TRACT | Status: DC
  Administered 2023-05-06: 1 via RESPIRATORY_TRACT
  Filled 2023-05-05: qty 28

## 2023-05-05 MED ORDER — INSULIN ASPART 100 UNIT/ML IJ SOLN: 0.0000 [IU] | Freq: Three times a day (TID) | INTRAMUSCULAR | Status: DC

## 2023-05-05 MED ORDER — INSULIN ASPART 100 UNIT/ML IJ SOLN
0.0000 [IU] | Freq: Three times a day (TID) | INTRAMUSCULAR | Status: DC
  Administered 2023-05-05: 8 [IU] via SUBCUTANEOUS

## 2023-05-05 MED ORDER — INSULIN ASPART 100 UNIT/ML IJ SOLN: 0.0000 [IU] | Freq: Every day | INTRAMUSCULAR | Status: DC

## 2023-05-05 MED ORDER — BUDESON-GLYCOPYRROL-FORMOTEROL 160-9-4.8 MCG/ACT IN AERO
2.0000 | INHALATION_SPRAY | Freq: Every day | RESPIRATORY_TRACT | Status: DC
Start: 2023-05-05 — End: 2023-05-05

## 2023-05-05 MED ORDER — UMECLIDINIUM BROMIDE 62.5 MCG/ACT IN AEPB
1.0000 | INHALATION_SPRAY | Freq: Every day | RESPIRATORY_TRACT | Status: DC
  Administered 2023-05-06: 1 via RESPIRATORY_TRACT
  Filled 2023-05-05: qty 7

## 2023-05-05 MED ORDER — ENSURE ENLIVE PO LIQD
237.0000 mL | Freq: Two times a day (BID) | ORAL | Status: DC
  Administered 2023-05-06: 237 mL via ORAL

## 2023-05-05 MED ORDER — ACETAMINOPHEN 650 MG RE SUPP: 650.0000 mg | RECTAL | Status: DC | PRN

## 2023-05-05 MED ORDER — ACETAMINOPHEN 160 MG/5ML PO SOLN: 650.0000 mg | ORAL | Status: DC | PRN

## 2023-05-05 MED ORDER — SODIUM CHLORIDE 0.9% FLUSH: 3.0000 mL | INTRAVENOUS | Status: DC | PRN

## 2023-05-05 MED ORDER — ACETAMINOPHEN 325 MG PO TABS: 650.0000 mg | ORAL_TABLET | ORAL | Status: DC | PRN

## 2023-05-05 MED ORDER — CLOPIDOGREL BISULFATE 75 MG PO TABS
75.0000 mg | ORAL_TABLET | Freq: Every day | ORAL | Status: DC
  Administered 2023-05-05 – 2023-05-06 (×2): 75 mg via ORAL
  Filled 2023-05-05 (×2): qty 1

## 2023-05-05 MED ORDER — ENOXAPARIN SODIUM 30 MG/0.3ML IJ SOSY
30.0000 mg | PREFILLED_SYRINGE | INTRAMUSCULAR | Status: DC
  Administered 2023-05-05: 30 mg via SUBCUTANEOUS
  Filled 2023-05-05: qty 0.3

## 2023-05-05 MED ORDER — ENOXAPARIN SODIUM 40 MG/0.4ML IJ SOSY: 40.0000 mg | PREFILLED_SYRINGE | INTRAMUSCULAR | Status: DC

## 2023-05-05 MED ORDER — ROSUVASTATIN CALCIUM 5 MG PO TABS
10.0000 mg | ORAL_TABLET | Freq: Every day | ORAL | Status: DC
  Administered 2023-05-05 – 2023-05-06 (×2): 10 mg via ORAL
  Filled 2023-05-05 (×2): qty 2

## 2023-05-05 MED ORDER — SENNOSIDES-DOCUSATE SODIUM 8.6-50 MG PO TABS: 1.0000 | ORAL_TABLET | Freq: Every evening | ORAL | Status: DC | PRN

## 2023-05-05 MED ORDER — INSULIN GLARGINE 100 UNIT/ML ~~LOC~~ SOLN
10.0000 [IU] | Freq: Every day | SUBCUTANEOUS | Status: DC
  Administered 2023-05-05: 10 [IU] via SUBCUTANEOUS
  Filled 2023-05-05 (×2): qty 0.1

## 2023-05-05 MED ORDER — ACETAMINOPHEN 500 MG PO TABS
1000.0000 mg | ORAL_TABLET | Freq: Once | ORAL | Status: AC
  Administered 2023-05-05: 1000 mg via ORAL
  Filled 2023-05-05: qty 2

## 2023-05-05 MED ORDER — ASPIRIN 81 MG PO CHEW
81.0000 mg | CHEWABLE_TABLET | Freq: Every day | ORAL | Status: DC
  Administered 2023-05-05 – 2023-05-06 (×2): 81 mg via ORAL
  Filled 2023-05-05 (×2): qty 1

## 2023-05-05 MED ORDER — SODIUM CHLORIDE 0.9% FLUSH
3.0000 mL | Freq: Two times a day (BID) | INTRAVENOUS | Status: DC
  Administered 2023-05-05: 3 mL via INTRAVENOUS
  Administered 2023-05-06: 10 mL via INTRAVENOUS

## 2023-05-05 NOTE — Discharge Instructions (Signed)
 Dear Jackalyn Lombard,   Congratulations for your interest in quitting smoking!  Find a program that suits you best: when you want to quit, how you need support, where you live, and how you like to learn.    If you're ready to get started TODAY, consider scheduling a visit through [email or web address].  Appointments are available from 8am to 8pm, Monday to Friday.   Most health insurance plans will cover some level of tobacco cessation visits and medications.    Additional Resources: [employer] are also available to help you quit & provide the support you'll need. Many programs are available in both [place] and Spanish and have a long history of successfully helping people get off and stay off tobacco.    Quit Smoking Apps:  quitSTART at SeriousBroker.de QuitGuide?at ForgetParking.dk Online education and resources: Smokefree  at [employer].gov Free Telephone Coaching: QuitNow,  Call 1-800-QUIT-NOW ((502)101-2044) or Text- Ready to 3211161196 *Quitline [place] has teamed up with Medicaid to offer a free 14 week program    Vaping- Want to Quit? Free 24/7 support. Call [facility]  [place], [place], [place], [place], [place]  [facility]

## 2023-05-05 NOTE — Progress Notes (Signed)
 Patient arrived from med center. All appropriate orders entered. Stroke team will follow in consultation beginning tomorrow.  Bing Neighbors, MD [facility] [phone]  If 7pm- 7am, please page neurology on call as listed in AMION.

## 2023-05-05 NOTE — Plan of Care (Signed)

## 2023-05-05 NOTE — ED Notes (Signed)
 Called [facility] for transfer to [facility] @12 :50pm.  Spoke with Delice Bison

## 2023-05-05 NOTE — H&P (Signed)
 [facility] History and Physical  Jeffrey Stevens ZOX:[id number] DOB: October 01, 1950 DOA: 05/04/2023   PCP: Renaye Rakers, MD  Specialists: None  Chief Complaint: Transient confusion  HPI: Jeffrey Stevens is a 73 y.o. male with a past medical history of insulin-dependent diabetes, essential hypertension, hyperlipidemia who was in his usual state of health until yesterday evening when he had sudden confusion and memory loss according to his daughters.  He went up to go to the bathroom but ended up not going to the bathroom.  Went to a table and voided there.  He was trying to put on his shirt through his legs.  No recent illness or sickness.  He did have influenza about a month ago but he has recovered from that.  Patient was brought into the emergency department.  By the time he came to the emergency department he started recognizing his daughter.  The daughter has mentioned that his symptoms resolved in a few hours.  They feel like he is back to his baseline now.  Patient did have a headache during the night for which he was given medications in the emergency department.  This has never happened previously.  No weakness on any one side of his body though.  No falls or injuries.  No fever or chills recently.  In the emergency department he underwent CT head, CT angiogram head and neck.  No acute findings were noted.  Old stroke was noted.  Patient was seen by teleneurology.  They recommended further workup.  He was loaded with Plavix and then hospitalized for further management.  Home Medications: Prior to Admission medications   Medication Sig Start Date End Date Taking? Authorizing Provider  ACCU-CHEK SOFTCLIX LANCETS lancets  09/16/13  Yes [provider]  aspirin EC 81 MG tablet Take 81 mg by mouth at bedtime. Swallow whole.   Yes [provider]  BD PEN NEEDLE NANO U/F 32G X 4 MM MISC See admin instructions. 04/09/15  Yes [provider]   Budeson-Glycopyrrol-Formoterol (BREZTRI AEROSPHERE) 160-9-4.8 MCG/ACT AERO Inhale 2 puffs into the lungs at bedtime.   Yes [provider]  ferrous sulfate 325 (65 FE) MG tablet Take 325 mg by mouth daily with breakfast.   Yes [provider]  insulin glargine, 1 Unit Dial, (TOUJEO SOLOSTAR) 300 UNIT/ML Solostar Pen Inject 30 Units into the skin at bedtime.   Yes [provider]  JANUMET 50-1000 MG tablet Take 1 tablet by mouth 2 (two) times daily. 05/01/23  Yes [provider]  lisinopril-hydrochlorothiazide (PRINZIDE,ZESTORETIC) 20-12.5 MG per tablet Take 1 tablet by mouth daily. 06/01/14  Yes Richarda Overlie, MD  mirtazapine (REMERON) 7.5 MG tablet Take 7.5 mg by mouth at bedtime. 12/03/22  Yes [provider]  Multiple Vitamin (MULTIVITAMIN WITH MINERALS) TABS tablet Take 1 tablet by mouth daily.   Yes [provider]  ONE TOUCH ULTRA TEST test strip  09/16/13  Yes [provider]  pioglitazone (ACTOS) 15 MG tablet Take 15 mg by mouth daily. 03/29/23  Yes [provider]  rosuvastatin (CRESTOR) 10 MG tablet Take 10 mg by mouth daily. 06/16/17  Yes [provider]  benzonatate (TESSALON) 100 MG capsule Take 1 capsule (100 mg total) by mouth every 8 (eight) hours. Patient not taking: Reported on 05/05/2023 04/09/23   Smoot, Shawn Route, PA-C  metoCLOPramide (REGLAN) 10 MG tablet Take 1 tablet (10 mg total) by mouth every 6 (six) hours as needed for nausea (nausea/headache). Patient not taking: Reported on  05/05/2023 03/28/20   Charlynne Pander, MD  oseltamivir (TAMIFLU) 30 MG capsule Take 1 capsule (30 mg total) by mouth 2 (two) times daily. Patient not taking: Reported on 05/05/2023 04/09/23   Smoot, Shawn Route, PA-C    Allergies: No Known Allergies  Past Medical History: Past Medical History:  Diagnosis Date   Diabetes mellitus without complication (HCC)    Dyslipidemia    Hypertension    Pneumonia 05/23/2014    Past  Surgical History:  Procedure Laterality Date   COLONOSCOPY  2015    Social History: Lives with his daughter.  Quit smoking about 2 to 3 weeks ago.  Quit drinking several years ago.  Independent with daily activities usually.  Family History:  Family History  Problem Relation Age of Onset   Diabetes Mother    Diabetes Father      Review of Systems - History obtained from the patient and daughters General ROS: negative Psychological ROS: negative Ophthalmic ROS: negative ENT ROS: negative Allergy and Immunology ROS: negative Hematological and Lymphatic ROS: negative Endocrine ROS: negative Respiratory ROS: no cough, shortness of breath, or wheezing Cardiovascular ROS: no chest pain or dyspnea on exertion Gastrointestinal ROS: no abdominal pain, change in bowel habits, or black or bloody stools Genito-Urinary ROS: no dysuria, trouble voiding, or hematuria Musculoskeletal ROS: negative Neurological ROS: no TIA or stroke symptoms Dermatological ROS: negative  Physical Examination  Vitals:   05/05/23 1000 05/05/23 1140 05/05/23 1444 05/05/23 1456  BP: (!) 163/63 (!) 156/71 (!) 190/79 (!) 180/82  Pulse: (!) 55 (!) 50 (!) 56   Resp: 16 14 16    Temp:  97.7 F (36.5 C) 97.8 F (36.6 C)   TempSrc:  Oral Oral   SpO2: 100% 100% 100%   Weight:      Height:   5\' 9"  (1.753 m)     BP (!) 180/82 (BP Location: Right Arm)   Pulse (!) 56   Temp 97.8 F (36.6 C) (Oral)   Resp 16 Comment: 16  Ht 5\' 9"  (1.753 m)   Wt 54.2 kg   SpO2 100%   BMI 17.65 kg/m   General appearance: alert, cooperative, appears stated age, and no distress Head: Normocephalic, without obvious abnormality, atraumatic Eyes: conjunctivae/corneas clear. PERRL, EOM's intact.  Throat: lips, mucosa, and tongue normal; teeth and gums normal Neck: no adenopathy, no carotid bruit, no JVD, supple, symmetrical, trachea midline, and thyroid not enlarged, symmetric, no tenderness/mass/nodules Resp: clear to  auscultation bilaterally Cardio: regular rate and rhythm, S1, S2 normal, no murmur, click, rub or gallop GI: soft, patient in mild tenderness in the lower abdomen with palpation.  No masses organomegaly appreciated.  He was not complaining of any abdominal pain prior to examination.   Extremities: extremities normal, atraumatic, no cyanosis or edema Pulses: 2+ and symmetric Skin: Skin color, texture, turgor normal. No rashes or lesions Neurologic: He is awake alert.  Oriented to person.  Oriented to place but confused about the city.  Was able to tell the year and the month.  Cranial nerves II to XII intact.  Motor strength seems to be equal bilateral upper and lower extremities.  No pronator drift.   Labs on Admission: I have personally reviewed following labs and imaging studies  CBC: Recent Labs  Lab 05/04/23 2108 05/04/23 2110  WBC 6.3  --   NEUTROABS  --  4.4  HGB 10.9*  --   HCT 32.5*  --   MCV 94.2  --   PLT 166  --  Basic Metabolic Panel: Recent Labs  Lab 05/04/23 2108 05/04/23 2217  NA 141 141  K 3.6 3.5  CL 108 109  CO2 22 25  GLUCOSE 156* 164*  BUN 22 22  CREATININE 1.06 0.96  CALCIUM 9.4 9.0   GFR: Estimated Creatinine Clearance: 51.8 mL/min (by C-G formula based on SCr of 0.96 mg/dL).  Liver Function Tests: Recent Labs  Lab 05/04/23 2108 05/04/23 2217  AST 15 14*  ALT 14 13  ALKPHOS 37* 36*  BILITOT 0.8 0.6  PROT 6.9 6.4*  ALBUMIN 3.6 3.4*   Coagulation Profile: Recent Labs  Lab 05/04/23 2217  INR 1.1   CBG: Recent Labs  Lab 05/04/23 2039 05/05/23 0829 05/05/23 1202  GLUCAP 153* 110* 259*     Radiological Exams on Admission: CT ANGIO HEAD NECK W WO CM (CODE STROKE) Result Date: 05/04/2023 CLINICAL DATA:  Acute neurologic deficit EXAM: CT ANGIOGRAPHY HEAD AND NECK WITH AND WITHOUT CONTRAST TECHNIQUE: Multidetector CT imaging of the head and neck was performed using the standard protocol during bolus administration of intravenous  contrast. Multiplanar CT image reconstructions and MIPs were obtained to evaluate the vascular anatomy. Carotid stenosis measurements (when applicable) are obtained utilizing NASCET criteria, using the distal internal carotid diameter as the denominator. RADIATION DOSE REDUCTION: This exam was performed according to the departmental dose-optimization program which includes automated exposure control, adjustment of the mA and/or kV according to patient size and/or use of iterative reconstruction technique. CONTRAST:  75mL OMNIPAQUE IOHEXOL 350 MG/ML SOLN COMPARISON:  None Available. FINDINGS: CTA NECK FINDINGS Skeleton: No acute abnormality or high grade bony spinal canal stenosis. Other neck: Normal pharynx, larynx and major salivary glands. No cervical lymphadenopathy. Unremarkable thyroid gland. Upper chest: Biapical emphysema. Aortic arch: There is no calcific atherosclerosis of the aortic arch. Conventional 3 vessel aortic branching pattern. RIGHT carotid system: Normal without aneurysm, dissection or stenosis. LEFT carotid system: Normal without aneurysm, dissection or stenosis. Vertebral arteries: Codominant configuration. There is no dissection, occlusion or flow-limiting stenosis to the skull base (V1-V3 segments). CTA HEAD FINDINGS POSTERIOR CIRCULATION: Vertebral arteries are normal. No proximal occlusion of the anterior or inferior cerebellar arteries. Basilar artery is normal. Superior cerebellar arteries are normal. Posterior cerebral arteries are normal. ANTERIOR CIRCULATION: Atherosclerotic calcification of the internal carotid arteries at the skull base without hemodynamically significant stenosis. Anterior cerebral arteries are normal. Middle cerebral arteries are normal. Venous sinuses: As permitted by contrast timing, patent. Anatomic variants: None Review of the MIP images confirms the above findings. IMPRESSION: 1. No emergent large vessel occlusion or hemodynamically significant stenosis of the  head or neck. Electronically Signed   By: Deatra Robinson M.D.   On: 05/04/2023 22:17   CT HEAD CODE STROKE WO CONTRAST Result Date: 05/04/2023 CLINICAL DATA:  Code stroke.  Acute neurologic deficit EXAM: CT HEAD WITHOUT CONTRAST TECHNIQUE: Contiguous axial images were obtained from the base of the skull through the vertex without intravenous contrast. RADIATION DOSE REDUCTION: This exam was performed according to the departmental dose-optimization program which includes automated exposure control, adjustment of the mA and/or kV according to patient size and/or use of iterative reconstruction technique. COMPARISON:  None Available. FINDINGS: Brain: There is no mass, hemorrhage or extra-axial collection. The size and configuration of the ventricles and extra-axial CSF spaces are normal. There is hypoattenuation of the periventricular white matter, most commonly indicating chronic ischemic microangiopathy. Old left cerebellar infarct. Vascular: No abnormal hyperdensity of the major intracranial arteries or dural venous sinuses. No intracranial atherosclerosis. Skull:  The visualized skull base, calvarium and extracranial soft tissues are normal. Sinuses/Orbits: No fluid levels or advanced mucosal thickening of the visualized paranasal sinuses. No mastoid or middle ear effusion. The orbits are normal. ASPECTS (Alberta Stroke Program Early CT Score) - Ganglionic level infarction (caudate, lentiform nuclei, internal capsule, insula, M1-M3 cortex): 7 - Supraganglionic infarction (M4-M6 cortex): 3 Total score (0-10 with 10 being normal): 10 Extended scout image shows no metallic object in the head, neck, chest, abdomen or pelvis that would be a contraindication to MRI. IMPRESSION: 1. No acute intracranial abnormality. 2. ASPECTS is 10. 3. 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 These results were called by telephone at the time of interpretation on 05/04/2023 at 9:47 pm to provider Vanetta Mulders  , who verbally acknowledged these results. Electronically Signed   By: Deatra Robinson M.D.   On: 05/04/2023 21:49    My interpretation of Electrocardiogram: Sinus rhythm in the 50s.  Normal axis.  Intervals appear to be normal.  No concerning ST or T wave changes noted.  No Q waves.   Problem List  Principal Problem:   AMS (altered mental status)   Assessment: This is a 73 year old African-American male with past medical history as stated earlier who presented with transient confusion and memory loss.  Seems to be back to baseline now.  Differential diagnoses include acute stroke, TIA, seizure activity.  Does not seem to have an infectious etiology.  Plan:  #1. Transient neurological event: Proceed with MRI brain, EEG, echocardiogram.  Patient was seen by teleneurology and they loaded him with Plavix.  Will continue with aspirin and Plavix for now.  Will consult in-house neurology.  PT and OT evaluation.  Seems like he passed swallow screen since he is on a diet.  UA is unremarkable.  Urine drug screen unremarkable.  Alcohol level less than 10.  EKG shows sinus rhythm.  Check lipid panel.  Check HbA1c.  #2. Essential hypertension: Hold his antihypertensives to allow permissive hypertension.  #3. Insulin-dependent diabetes mellitus type 2: SSI.  Monitor CBGs.  Check HbA1c.  He is on long-acting insulin at home.  Will be continued at a lower dose.  Hold his oral agents for now.  #4. Normocytic anemia: Outpatient monitoring  #5.  Mild abdominal discomfort on palpation: No masses appreciated on examination.  Monitor for now.  May need further workup if he develops abdominal pain.  #6. History of hyperlipidemia: Continue statin.  #7. Noted to be on inhaler.  Has been a tobacco smoker but quit recently.  Continue with inhaler for now.  Lungs clear to auscultation.  DVT Prophylaxis: Lovenox Code Status: Full code Family Communication: Discussed with patient and his children Disposition:  Hopefully return home when improved hopefully return home when improved Consults called: Neurology Admission Status: Status is: Observation The patient remains OBS appropriate and will d/c before 2 midnights.    Severity of Illness: The appropriate patient status for this patient is OBSERVATION. Observation status is judged to be reasonable and necessary in order to provide the required intensity of service to ensure the patient's safety. The patient's presenting symptoms, physical exam findings, and initial radiographic and laboratory data in the context of their medical condition is felt to place them at decreased risk for further clinical deterioration. Furthermore, it is anticipated that the patient will be medically stable for discharge from the hospital within 2 midnights of admission.    Further management decisions will depend on results of further testing and patient's response  to treatment.   Jadakiss Barish [employer]  [facility] [employer] on [email or web address]  05/05/2023, 3:18 PM

## 2023-05-05 NOTE — Progress Notes (Signed)
 EEG complete - results pending

## 2023-05-05 NOTE — ED Notes (Signed)
 [facility] into transfer to [facility]

## 2023-05-05 NOTE — ED Notes (Signed)
Pt sitting up at bedside eating breakfast.

## 2023-05-05 NOTE — Progress Notes (Signed)
   05/05/23 1817  What Happened  Was fall witnessed? No  Was patient injured? No  Patient found in bathroom  Found by Staff-comment (family)  Stated prior activity bathroom-unassisted  Provider Notification  Provider Name/Title Osvaldo Shipper, MD  Date Provider Notified 05/05/23  Time Provider Notified 1823  Method of Notification Page  Notification Reason Fall  Provider response No new orders  Date of Provider Response 05/05/23  Time of Provider Response 1824  Follow Up  Family notified Yes - comment (family at bedside at time)  Time family notified 1817  Additional tests No  Simple treatment Other (comment) (no pain per pt.)  Adult Fall Risk Assessment  Risk Factor Category (scoring not indicated) High fall risk per protocol (document High fall risk)  Age 73  Fall History: Fall within 6 months prior to admission 5  Elimination; Bowel and/or Urine Incontinence 0  Elimination; Bowel and/or Urine Urgency/Frequency 0  Medications: includes PCA/Opiates, Anti-convulsants, Anti-hypertensives, Diuretics, Hypnotics, Laxatives, Sedatives, and Psychotropics 3  Patient Care Equipment 0  Mobility-Assistance 2  Mobility-Gait 2  Mobility-Sensory Deficit 0  Altered awareness of immediate physical environment 0  Impulsiveness 0  Lack of understanding of one's physical/cognitive limitations 0  Total Score 14  Patient Fall Risk Level High fall risk  Adult Fall Risk Interventions  Required Bundle Interventions *See Row Information* High fall risk - low, moderate, and high requirements implemented  Additional Interventions Use of appropriate toileting equipment (bedpan, BSC, etc.);HeadStart bed sensor (education/return demonstration)  Fall intervention(s) refused/Patient educated regarding refusal Bed alarm;Nonskid socks;Open door if unsupervised  Screening for Fall Injury Risk (To be completed on HIGH fall risk patients) - Assessing Need for Floor Mats  Risk For Fall Injury- Criteria for  Floor Mats Admitted as a result of a fall  Will Implement Floor Mats Yes (when family not at bedside)  Vitals  ECG Heart Rate 97

## 2023-05-05 NOTE — ED Provider Notes (Signed)
 Emergency Medicine Observation Re-evaluation Note  Jeffrey Stevens is a 73 y.o. male, seen on rounds today.  Pt initially presented to the ED for complaints of Altered Mental Status Currently, the patient is awake resting in bed, stating that he feels off but has no specific complaints.  Physical Exam  BP (!) 141/73   Pulse (!) 43   Temp 98.1 F (36.7 C) (Oral)   Resp 16   Wt 54.2 kg   SpO2 100%   BMI 17.65 kg/m  Physical Exam General: Awake and alert, no acute distress Cardiac: Mildly bradycardic, regular Lungs: No increased work of breathing Neuro: A&O x3, cranial nerves intact, no drift in all 4 extremities, normal finger-to-nose, normal sensation throughout Psych: Calm, cooperative  ED Course / MDM  EKG:EKG Interpretation Date/Time:  Thursday May 04 2023 20:45:33 EDT Ventricular Rate:  58 PR Interval:  141 QRS Duration:  82 QT Interval:  428 QTC Calculation: 421 R Axis:   75  Text Interpretation: Sinus rhythm Confirmed by Jeffrey Stevens [phone]) on 05/04/2023 9:05:40 PM  I have reviewed the labs performed to date as well as medications administered while in observation.  Recent changes in the last 24 hours include patient had workup for altered mental status versus possible TIA or stroke.  Was recommended to start on aspirin and Plavix by neurology and admitted for further evaluation, pending bed.  Accu-Cheks and medications ordered for this morning.  Plan  Current plan is for admission to [facility].    Jeffrey Maus, DO 05/05/23 [phone]

## 2023-05-05 NOTE — Procedures (Signed)
 Patient Name: Jeffrey Stevens  MRN: [id number]  Epilepsy Attending: Charlsie Quest  Referring Physician/Provider: Osvaldo Shipper, MD  Date: 05/05/2023 Duration: 27.17 mins  Patient history: 73yo M with transient confusion. EEG to evaluate for seizure  Level of alertness: Awake  AEDs during EEG study: None  Technical aspects: This EEG study was done with scalp electrodes positioned according to the 10-20 International system of electrode placement. Electrical activity was reviewed with band pass filter of 1-70Hz , sensitivity of 7 uV/mm, display speed of 58mm/sec with a 60Hz  notched filter applied as appropriate. EEG data were recorded continuously and digitally stored.  Video monitoring was available and reviewed as appropriate.  Description: The posterior dominant rhythm consists of 8 Hz activity of moderate voltage (25-35 uV) seen predominantly in posterior head regions, symmetric and reactive to eye opening and eye closing. Physiologic photic driving was not seen during photic stimulation.  Hyperventilation was not performed.     IMPRESSION: This study is within normal limits. No seizures or epileptiform discharges were seen throughout the recording.  A normal interictal EEG does not exclude the diagnosis of epilepsy.  Averleigh Savary Annabelle Harman

## 2023-05-05 NOTE — ED Notes (Signed)
 Pt ambulating to BR with daughter

## 2023-05-05 NOTE — Progress Notes (Signed)
 [facility] admits paged for provider and admission orders

## 2023-05-06 ENCOUNTER — Observation Stay (HOSPITAL_COMMUNITY)

## 2023-05-06 ENCOUNTER — Observation Stay (HOSPITAL_BASED_OUTPATIENT_CLINIC_OR_DEPARTMENT_OTHER)

## 2023-05-06 DIAGNOSIS — R4182 Altered mental status, unspecified: Principal | ICD-10-CM

## 2023-05-06 DIAGNOSIS — R410 Disorientation, unspecified: Secondary | ICD-10-CM

## 2023-05-06 DIAGNOSIS — G459 Transient cerebral ischemic attack, unspecified: Secondary | ICD-10-CM

## 2023-05-06 LAB — HIV ANTIBODY (ROUTINE TESTING W REFLEX): HIV Screen 4th Generation wRfx: NONREACTIVE

## 2023-05-06 LAB — ECHOCARDIOGRAM COMPLETE
AR max vel: 2.62 cm2
AV Peak grad: 4.4 mmHg
Ao pk vel: 1.05 m/s
Area-P 1/2: 2.76 cm2
Height: 69 in
S' Lateral: 2.5 cm
Weight: 1912 [oz_av]

## 2023-05-06 LAB — BASIC METABOLIC PANEL
Anion gap: 9 (ref 5–15)
BUN: 11 mg/dL (ref 8–23)
CO2: 23 mmol/L (ref 22–32)
Calcium: 8.9 mg/dL (ref 8.9–10.3)
Chloride: 106 mmol/L (ref 98–111)
Creatinine, Ser: 0.87 mg/dL (ref 0.61–1.24)
GFR, Estimated: >60 mL/min (ref >60)
Glucose, Bld: 253 mg/dL — ABNORMAL HIGH (ref 70–99)
Potassium: 3.6 mmol/L (ref 3.5–5.1)
Sodium: 138 mmol/L (ref 135–145)

## 2023-05-06 LAB — LIPID PANEL
Cholesterol: 158 mg/dL (ref 0–200)
HDL: 70 mg/dL (ref >40)
LDL Cholesterol: 78 mg/dL (ref 0–99)
Total CHOL/HDL Ratio: 2.3 ratio
Triglycerides: 50 mg/dL (ref <150)
VLDL: 10 mg/dL (ref 0–40)

## 2023-05-06 LAB — CBC
HCT: 35.6 % — ABNORMAL LOW (ref 39.0–52.0)
Hemoglobin: 12.2 g/dL — ABNORMAL LOW (ref 13.0–17.0)
MCH: 31.5 pg (ref 26.0–34.0)
MCHC: 34.3 g/dL (ref 30.0–36.0)
MCV: 92 fL (ref 80.0–100.0)
Platelets: 168 10*3/uL (ref 150–400)
RBC: 3.87 MIL/uL — ABNORMAL LOW (ref 4.22–5.81)
RDW: 14.6 % (ref 11.5–15.5)
WBC: 4.8 10*3/uL (ref 4.0–10.5)
nRBC: 0 % (ref 0.0–0.2)

## 2023-05-06 LAB — IRON AND TIBC
Iron: 58 ug/dL (ref 45–182)
Saturation Ratios: 18 % (ref 17.9–39.5)
TIBC: 329 ug/dL (ref 250–450)
UIBC: 271 ug/dL

## 2023-05-06 LAB — GLUCOSE, CAPILLARY
Glucose-Capillary: 239 mg/dL — ABNORMAL HIGH (ref 70–99)
Glucose-Capillary: 219 mg/dL — ABNORMAL HIGH (ref 70–99)
Glucose-Capillary: 159 mg/dL — ABNORMAL HIGH (ref 70–99)
Glucose-Capillary: 425 mg/dL — ABNORMAL HIGH (ref 70–99)

## 2023-05-06 LAB — FOLATE: Folate: 15.6 ng/mL (ref >5.9)

## 2023-05-06 LAB — TSH: TSH: 2.142 u[IU]/mL (ref 0.350–4.500)

## 2023-05-06 LAB — HEMOGLOBIN A1C
Hgb A1c MFr Bld: 8.1 % — ABNORMAL HIGH (ref 4.8–5.6)
Mean Plasma Glucose: 185.77 mg/dL

## 2023-05-06 LAB — MAGNESIUM: Magnesium: 1.9 mg/dL (ref 1.7–2.4)

## 2023-05-06 LAB — RETICULOCYTES
Immature Retic Fract: 4.4 % (ref 2.3–15.9)
RBC.: 3.84 MIL/uL — ABNORMAL LOW (ref 4.22–5.81)
Retic Count, Absolute: 35.7 10*3/uL (ref 19.0–186.0)
Retic Ct Pct: 0.9 % (ref 0.4–3.1)

## 2023-05-06 LAB — VITAMIN B12: Vitamin B-12: 464 pg/mL (ref 180–914)

## 2023-05-06 LAB — FERRITIN: Ferritin: 11 ng/mL — ABNORMAL LOW (ref 24–336)

## 2023-05-06 LAB — RPR: RPR Ser Ql: NONREACTIVE

## 2023-05-06 MED ORDER — ROSUVASTATIN CALCIUM 20 MG PO TABS: 20.0000 mg | ORAL_TABLET | Freq: Every day | ORAL | Status: DC

## 2023-05-06 MED ORDER — ASPIRIN EC 81 MG PO TBEC: 81.0000 mg | DELAYED_RELEASE_TABLET | Freq: Every day | ORAL | 0 refills | Status: AC

## 2023-05-06 MED ORDER — INSULIN ASPART 100 UNIT/ML IJ SOLN
20.0000 [IU] | Freq: Once | INTRAMUSCULAR | Status: AC
  Administered 2023-05-06: 20 [IU] via SUBCUTANEOUS

## 2023-05-06 MED ORDER — ROSUVASTATIN CALCIUM 20 MG PO TABS: 20.0000 mg | ORAL_TABLET | Freq: Every day | ORAL | 1 refills | Status: AC

## 2023-05-06 MED ORDER — INSULIN ASPART 100 UNIT/ML IJ SOLN: 0.0000 [IU] | Freq: Every day | INTRAMUSCULAR | Status: DC

## 2023-05-06 MED ORDER — INSULIN ASPART 100 UNIT/ML IJ SOLN
0.0000 [IU] | Freq: Three times a day (TID) | INTRAMUSCULAR | Status: DC
  Administered 2023-05-06: 3 [IU] via SUBCUTANEOUS
  Administered 2023-05-06: 2 [IU] via SUBCUTANEOUS

## 2023-05-06 MED ORDER — INSULIN GLARGINE 100 UNIT/ML ~~LOC~~ SOLN
15.0000 [IU] | Freq: Every day | SUBCUTANEOUS | Status: DC
  Filled 2023-05-06: qty 0.15

## 2023-05-06 MED ORDER — CLOPIDOGREL BISULFATE 75 MG PO TABS: 75.0000 mg | ORAL_TABLET | Freq: Every day | ORAL | 3 refills | Status: AC

## 2023-05-06 NOTE — Progress Notes (Signed)
 Per MD Rito Ehrlich; patient and family have agreed on outpatient PT after speaking with MD. Per MD Osvaldo Shipper, pt okay to d/c at this time.

## 2023-05-06 NOTE — Discharge Summary (Incomplete)
 [facility]  Physician Discharge Summary   Patient ID: Jeffrey Stevens MRN: [id number] DOB/AGE: [birth year] 73 y.o.  Admit date: 05/04/2023 Discharge date: 05/06/2023    PCP: Renaye Rakers, MD  DISCHARGE DIAGNOSES:  Transient loss of memory and weakness, etiology unclear Cannot rule out TIA Ambulatory dysfunction Essential hypertension Insulin-dependent diabetes mellitus type 2 Normocytic anemia History of iron deficiency Hyperlipidemia    RECOMMENDATIONS FOR OUTPATIENT FOLLOW UP: Ambulatory referral sent to neurology   Home Health: Outpatient PT Equipment/Devices: None  CODE STATUS: Full code  DISCHARGE CONDITION: fair  Diet recommendation: Modified carbohydrate  INITIAL HISTORY: 73 y.o. male with a past medical history of insulin-dependent diabetes, essential hypertension, hyperlipidemia who was in his usual state of health until the evening prior to admission when he had sudden confusion and memory loss according to his daughters.  Concern was for TIA.  He was hospitalized for further management   Consultants: Neurology   Procedures: Echocardiogram  HOSPITAL COURSE:   Transient neurological event Stroke has been ruled out on MRI.  Old infarct was noted.   Patient underwent CT angiogram head and neck as well.   Could have had a TIA.  No infectious etiology has been found.  Urine drug screen was unremarkable.  EEG was also unremarkable. Patient's daughter today mentions that he has had imbalance issues as well as memory impairment for some time now.  Patient could have cognitive impairment. LDL is 78.  Vitamin B12 level is 464.  TSH is 2.14.  RPR and HIV nonreactive.  HbA1c 8.1. Echocardiogram shows normal LVEF. Seen by physical therapy and Occupational Therapy.  Outpatient PT is recommended. MRI of the cervical thoracic and lumbar spine shows evidence for arthritis and degenerative disease but no spinal stenosis noted.  Patient was able to ambulate with  physical therapy. Ambulatory referral sent to outpatient neurology for cognitive workup.   Essential hypertension Corrie Dandy resume Home medications   Insulin-dependent diabetes mellitus type 2 HbA1c 8.1.  May resume home medications   Normocytic anemia/iron deficiency Stable hemoglobin.   Anemia panel reviewed.  Ferritin is 11, iron is 58, TIBC 329, percent saturation 18.  Folic acid level 15.6.  Vitamin B12 564.   Continue iron supplements   Hyperlipidemia LDL is 78.  Continue statin.   Patient is stable.  Discussed with neurology.  Okay for discharge home today.   PERTINENT LABS:  The results of significant diagnostics from this hospitalization (including imaging, microbiology, ancillary and laboratory) are listed below for reference.      Labs:   Basic Metabolic Panel: Recent Labs  Lab 05/04/23 2108 05/04/23 2217 05/06/23 0550  NA 141 141 138  K 3.6 3.5 3.6  CL 108 109 106  CO2 22 25 23   GLUCOSE 156* 164* 253*  BUN 22 22 11   CREATININE 1.06 0.96 0.87  CALCIUM 9.4 9.0 8.9  MG  --   --  1.9   Liver Function Tests: Recent Labs  Lab 05/04/23 2108 05/04/23 2217  AST 15 14*  ALT 14 13  ALKPHOS 37* 36*  BILITOT 0.8 0.6  PROT 6.9 6.4*  ALBUMIN 3.6 3.4*    CBC: Recent Labs  Lab 05/04/23 2108 05/04/23 2110 05/06/23 0550  WBC 6.3  --  4.8  NEUTROABS  --  4.4  --   HGB 10.9*  --  12.2*  HCT 32.5*  --  35.6*  MCV 94.2  --  92.0  PLT 166  --  168    CBG: Recent Labs  Lab 05/05/23 2049 05/06/23 0521 05/06/23 0732 05/06/23 1209 05/06/23 1604  GLUCAP 369* 239* 219* 159* 425*     IMAGING STUDIES ECHOCARDIOGRAM COMPLETE Result Date: 05/06/2023    ECHOCARDIOGRAM REPORT   Patient Name:   Jeffrey Stevens Date of Exam: 05/06/2023 Medical Rec #:  [id number]        Height:       69.0 in Accession #:    [id number]       Weight:       119.5 lb Date of Birth:  07-11-1950         BSA:          1.660 m Patient Age:    73 years         BP:           129/72 mmHg  Patient Gender: M                HR:           61 bpm. Exam Location:  Inpatient Procedure: 2D Echo, Cardiac Doppler and Color Doppler (Both Spectral and Color            Flow Doppler were utilized during procedure). Indications:    TIA G45.9  History:        Patient has no prior history of Echocardiogram examinations.                 COPD; Risk Factors:Hypertension, Diabetes, Dyslipidemia and                 Current Smoker.  Sonographer:    Lucendia Herrlich RCS Referring Phys: [id number] Osvaldo Shipper IMPRESSIONS  1. Left ventricular ejection fraction, by estimation, is 60 to 65%. The left ventricle has normal function. The left ventricle has no regional wall motion abnormalities. Left ventricular diastolic parameters are consistent with Grade I diastolic dysfunction (impaired relaxation).  2. Right ventricular systolic function is normal. The right ventricular size is normal. Tricuspid regurgitation signal is inadequate for assessing PA pressure.  3. The mitral valve is normal in structure. No evidence of mitral valve regurgitation.  4. The aortic valve was not well visualized. Aortic valve regurgitation is not visualized.  5. The inferior vena cava is normal in size with greater than 50% respiratory variability, suggesting right atrial pressure of 3 mmHg.  6. Cannot exclude a small PFO. FINDINGS  Left Ventricle: Left ventricular ejection fraction, by estimation, is 60 to 65%. The left ventricle has normal function. The left ventricle has no regional wall motion abnormalities. The left ventricular internal cavity size was normal in size. There is  no left ventricular hypertrophy. Left ventricular diastolic parameters are consistent with Grade I diastolic dysfunction (impaired relaxation). Right Ventricle: The right ventricular size is normal. Right ventricular systolic function is normal. Tricuspid regurgitation signal is inadequate for assessing PA pressure. Left Atrium: Left atrial size was normal in size. Right  Atrium: Right atrial size was normal in size. Pericardium: There is no evidence of pericardial effusion. Mitral Valve: The mitral valve is normal in structure. No evidence of mitral valve regurgitation. Tricuspid Valve: Tricuspid valve regurgitation is not demonstrated. Aortic Valve: The aortic valve was not well visualized. Aortic valve regurgitation is not visualized. Aortic valve peak gradient measures 4.4 mmHg. Pulmonic Valve: Pulmonic valve regurgitation is not visualized. Aorta: The aortic root and ascending aorta are structurally normal, with no evidence of dilitation. Venous: The inferior vena cava is normal in size with greater than 50% respiratory variability, suggesting  right atrial pressure of 3 mmHg. IAS/Shunts: Cannot exclude a small PFO.  LEFT VENTRICLE PLAX 2D LVIDd:         3.90 cm   Diastology LVIDs:         2.50 cm   LV e' medial:    9.36 cm/s LV PW:         0.80 cm   LV E/e' medial:  8.5 LV IVS:        0.80 cm   LV e' lateral:   11.60 cm/s LVOT diam:     2.00 cm   LV E/e' lateral: 6.9 LV SV:         53 LV SV Index:   32 LVOT Area:     3.14 cm  RIGHT VENTRICLE             IVC RV S prime:     10.60 cm/s  IVC diam: 1.80 cm TAPSE (M-mode): 1.7 cm LEFT ATRIUM             Index        RIGHT ATRIUM          Index LA diam:        3.00 cm 1.81 cm/m   RA Area:     9.82 cm LA Vol (A2C):   16.5 ml 9.94 ml/m   RA Volume:   19.10 ml 11.51 ml/m LA Vol (A4C):   15.7 ml 9.46 ml/m LA Biplane Vol: 16.7 ml 10.06 ml/m  AORTIC VALVE AV Area (Vmax): 2.62 cm AV Vmax:        105.00 cm/s AV Peak Grad:   4.4 mmHg LVOT Vmax:      87.50 cm/s LVOT Vmean:     53.800 cm/s LVOT VTI:       0.169 m  AORTA Ao Root diam: 3.40 cm MITRAL VALVE MV Area (PHT): 2.76 cm     SHUNTS MV Decel Time: 275 msec     Systemic VTI:  0.17 m MV E velocity: 79.70 cm/s   Systemic Diam: 2.00 cm MV A velocity: 110.00 cm/s MV E/A ratio:  0.72 Photographer signed by Carolan Clines Signature Date/Time: 05/06/2023/3:51:21 PM    Final     MR LUMBAR SPINE WO CONTRAST Result Date: 05/06/2023 CLINICAL DATA:  Lumbar radiculopathy, symptoms persist with greater than 6 weeks of treatment. EXAM: MRI LUMBAR SPINE WITHOUT CONTRAST TECHNIQUE: Multiplanar, multisequence MR imaging of the lumbar spine was performed. No intravenous contrast was administered. COMPARISON:  None Available. FINDINGS: Only partial examination achieved because of limited patient tolerance. Segmentation: 5 lumbar type vertebral bodies assumed. Alignment:  Degenerative anterolisthesis at L4-5 of 3 mm. Vertebrae: No fracture or focal bone lesion. Edematous change of the facet joints at L4-5. Conus medullaris and cauda equina: Conus extends to the L1-2 level. Distal cord and conus appear unremarkable. Paraspinal and other soft tissues: Negative Disc levels: No disc level abnormality from T10-11 through L3-4. L4-5: Bilateral facet arthropathy with degenerative anterolisthesis of 3 mm. Bulging of the disc. Stenosis of both lateral recesses. Foraminal narrowing left more than right. Neural compression or irritation could occur at this level. The facet arthritis would likely be a cause of back pain or referred facet syndrome pain. L5-S1: Disc degeneration with shallow protrusion. Facet degeneration and hypertrophy. No central canal stenosis. Narrowing of the subarticular lateral recesses and neural foramina that could be symptomatic. The facet arthritis could be painful. IMPRESSION: 1. Only partial examination achieved because of limited patient tolerance. 2. L4-5:  Bilateral facet arthropathy with degenerative anterolisthesis of 3 mm. Bulging of the disc. Stenosis of both lateral recesses. Foraminal narrowing left more than right. Neural compression or irritation could occur at this level. The facet arthritis would likely be a cause of back pain or referred facet syndrome pain. 3. L5-S1: Disc degeneration with shallow protrusion. Facet degeneration and hypertrophy. Narrowing of the  subarticular lateral recesses and neural foramina that could be symptomatic. The facet arthritis could be painful. Electronically Signed   By: Paulina Fusi M.D.   On: 05/06/2023 14:56   MR THORACIC SPINE WO CONTRAST Result Date: 05/06/2023 CLINICAL DATA:  Ataxia.  Thoracic pathology suspected. EXAM: MRI THORACIC SPINE WITHOUT CONTRAST TECHNIQUE: Multiplanar, multisequence MR imaging of the thoracic spine was performed. No intravenous contrast was administered. COMPARISON:  Cervical study same day. FINDINGS: The thoracic examination is severely degraded by motion. Alignment: No malalignment. Vertebrae: No fracture or focal bone lesion. Cord: Better shown on the cervical exam, there is abnormal T2 signal within the central cord at the T2 level. Below that, cord detail as shown on this thoracic exam is inadequate for evaluation because of the motion degradation. The abnormality at the T2 level could well be due to small-vessel ischemic change in this patient with a history of diabetes and hypertension. Paraspinal and other soft tissues: Otherwise negative Disc levels: No evidence of disc level pathology. No compressive narrowing of the canal or foramina. IMPRESSION: 1. Severely motion degraded exam. 2. Abnormal T2 signal within the central cord at the T2 level. This is better shown on the cervical exam. Below that, cord detail as shown on this thoracic exam is inadequate for evaluation because of the motion degradation. The abnormality at the T2 level could well be due to small-vessel ischemic change in this patient with a history of diabetes and hypertension. Electronically Signed   By: Paulina Fusi M.D.   On: 05/06/2023 14:54   MR CERVICAL SPINE WO CONTRAST Result Date: 05/06/2023 CLINICAL DATA:  Ataxia.  Cervical pathology suspected. EXAM: MRI CERVICAL SPINE WITHOUT CONTRAST TECHNIQUE: Multiplanar, multisequence MR imaging of the cervical spine was performed. No intravenous contrast was administered.  COMPARISON:  None Available. FINDINGS: Alignment: No malalignment. Straightening of the normal cervical lordosis. Vertebrae: No fracture or focal bone lesion. No edematous endplate marrow changes or advanced facet arthritis. Cord: No cervical cord compression or abnormal cord signal in the cervical region. Posterior Fossa, vertebral arteries, paraspinal tissues: Negative Disc levels: No significant disc level pathology at C5-6 or above. No compressive narrowing of the canal or foramina. C6-7: Endplate osteophytes and bulging of the disc. Moderate bilateral foraminal stenosis. C7-T1: Endplate osteophytes and bulging of the disc. Moderate bilateral foraminal stenosis. IMPRESSION: 1. No abnormality of the cervical cord. 2. C6-7: Moderate bilateral foraminal stenosis. 3. C7-T1: Moderate bilateral foraminal stenosis. Electronically Signed   By: Paulina Fusi M.D.   On: 05/06/2023 14:49   EEG adult Result Date: 05/05/2023 Charlsie Quest, MD     05/05/2023 11:03 PM Patient Name: Jeffrey Stevens MRN: [id number] Epilepsy Attending: Charlsie Quest Referring Physician/Provider: Osvaldo Shipper, MD Date: 05/05/2023 Duration: 27.17 mins Patient history: 73yo M with transient confusion. EEG to evaluate for seizure Level of alertness: Awake AEDs during EEG study: None Technical aspects: This EEG study was done with scalp electrodes positioned according to the 10-20 International system of electrode placement. Electrical activity was reviewed with band pass filter of 1-70Hz , sensitivity of 7 uV/mm, display speed of 47mm/sec with a 60Hz  notched filter  applied as appropriate. EEG data were recorded continuously and digitally stored.  Video monitoring was available and reviewed as appropriate. Description: The posterior dominant rhythm consists of 8 Hz activity of moderate voltage (25-35 uV) seen predominantly in posterior head regions, symmetric and reactive to eye opening and eye closing. Physiologic photic driving was not seen  during photic stimulation.  Hyperventilation was not performed.   IMPRESSION: This study is within normal limits. No seizures or epileptiform discharges were seen throughout the recording. A normal interictal EEG does not exclude the diagnosis of epilepsy. Charlsie Quest   MR BRAIN WO CONTRAST Result Date: 05/05/2023 CLINICAL DATA:  Altered mental status EXAM: MRI HEAD WITHOUT CONTRAST TECHNIQUE: Multiplanar, multiecho pulse sequences of the brain and surrounding structures were obtained without intravenous contrast. COMPARISON:  None Available. FINDINGS: Brain: No acute infarct, mass effect or extra-axial collection. No acute or chronic hemorrhage. There is multifocal hyperintense T2-weighted signal within the white matter. Generalized volume loss. Old left cerebellar infarct. The midline structures are normal. Vascular: Normal flow voids. Skull and upper cervical spine: Normal calvarium and skull base. Visualized upper cervical spine and soft tissues are normal. Sinuses/Orbits:No paranasal sinus fluid levels or advanced mucosal thickening. No mastoid or middle ear effusion. Normal orbits. IMPRESSION: 1. No acute intracranial abnormality. 2. 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 Electronically Signed   By: Deatra Robinson M.D.   On: 05/05/2023 20:53   CT ANGIO HEAD NECK W WO CM (CODE STROKE) Result Date: 05/04/2023 CLINICAL DATA:  Acute neurologic deficit EXAM: CT ANGIOGRAPHY HEAD AND NECK WITH AND WITHOUT CONTRAST TECHNIQUE: Multidetector CT imaging of the head and neck was performed using the standard protocol during bolus administration of intravenous contrast. Multiplanar CT image reconstructions and MIPs were obtained to evaluate the vascular anatomy. Carotid stenosis measurements (when applicable) are obtained utilizing NASCET criteria, using the distal internal carotid diameter as the denominator. RADIATION DOSE REDUCTION: This exam was performed according to the  departmental dose-optimization program which includes automated exposure control, adjustment of the mA and/or kV according to patient size and/or use of iterative reconstruction technique. CONTRAST:  75mL OMNIPAQUE IOHEXOL 350 MG/ML SOLN COMPARISON:  None Available. FINDINGS: CTA NECK FINDINGS Skeleton: No acute abnormality or high grade bony spinal canal stenosis. Other neck: Normal pharynx, larynx and major salivary glands. No cervical lymphadenopathy. Unremarkable thyroid gland. Upper chest: Biapical emphysema. Aortic arch: There is no calcific atherosclerosis of the aortic arch. Conventional 3 vessel aortic branching pattern. RIGHT carotid system: Normal without aneurysm, dissection or stenosis. LEFT carotid system: Normal without aneurysm, dissection or stenosis. Vertebral arteries: Codominant configuration. There is no dissection, occlusion or flow-limiting stenosis to the skull base (V1-V3 segments). CTA HEAD FINDINGS POSTERIOR CIRCULATION: Vertebral arteries are normal. No proximal occlusion of the anterior or inferior cerebellar arteries. Basilar artery is normal. Superior cerebellar arteries are normal. Posterior cerebral arteries are normal. ANTERIOR CIRCULATION: Atherosclerotic calcification of the internal carotid arteries at the skull base without hemodynamically significant stenosis. Anterior cerebral arteries are normal. Middle cerebral arteries are normal. Venous sinuses: As permitted by contrast timing, patent. Anatomic variants: None Review of the MIP images confirms the above findings. IMPRESSION: 1. No emergent large vessel occlusion or hemodynamically significant stenosis of the head or neck. Electronically Signed   By: Deatra Robinson M.D.   On: 05/04/2023 22:17   CT HEAD CODE STROKE WO CONTRAST Result Date: 05/04/2023 CLINICAL DATA:  Code stroke.  Acute neurologic deficit EXAM: CT HEAD WITHOUT CONTRAST TECHNIQUE:  Contiguous axial images were obtained from the base of the skull through the  vertex without intravenous contrast. RADIATION DOSE REDUCTION: This exam was performed according to the departmental dose-optimization program which includes automated exposure control, adjustment of the mA and/or kV according to patient size and/or use of iterative reconstruction technique. COMPARISON:  None Available. FINDINGS: Brain: There is no mass, hemorrhage or extra-axial collection. The size and configuration of the ventricles and extra-axial CSF spaces are normal. There is hypoattenuation of the periventricular white matter, most commonly indicating chronic ischemic microangiopathy. Old left cerebellar infarct. Vascular: No abnormal hyperdensity of the major intracranial arteries or dural venous sinuses. No intracranial atherosclerosis. Skull: The visualized skull base, calvarium and extracranial soft tissues are normal. Sinuses/Orbits: No fluid levels or advanced mucosal thickening of the visualized paranasal sinuses. No mastoid or middle ear effusion. The orbits are normal. ASPECTS (Alberta Stroke Program Early CT Score) - Ganglionic level infarction (caudate, lentiform nuclei, internal capsule, insula, M1-M3 cortex): 7 - Supraganglionic infarction (M4-M6 cortex): 3 Total score (0-10 with 10 being normal): 10 Extended scout image shows no metallic object in the head, neck, chest, abdomen or pelvis that would be a contraindication to MRI. IMPRESSION: 1. No acute intracranial abnormality. 2. ASPECTS is 10. 3. 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 These results were called by telephone at the time of interpretation on 05/04/2023 at 9:47 pm to provider Vanetta Mulders , who verbally acknowledged these results. Electronically Signed   By: Deatra Robinson M.D.   On: 05/04/2023 21:49   DG Chest Portable 1 View Result Date: 04/09/2023 CLINICAL DATA:  Cough.  Dizziness.  Blurry vision. EXAM: PORTABLE CHEST 1 VIEW COMPARISON:  08/07/2019. FINDINGS: Low lung volume. Bilateral lung fields  are clear. Bilateral costophrenic angles are clear. Normal cardio-mediastinal silhouette. No acute osseous abnormalities. The soft tissues are within normal limits. IMPRESSION: No active disease. Electronically Signed   By: Jules Schick M.D.   On: 04/09/2023 17:13   CT Head Wo Contrast Result Date: 04/09/2023 CLINICAL DATA:  Headache.  Dizziness and blurry vision. EXAM: CT HEAD WITHOUT CONTRAST TECHNIQUE: Contiguous axial images were obtained from the base of the skull through the vertex without intravenous contrast. RADIATION DOSE REDUCTION: This exam was performed according to the departmental dose-optimization program which includes automated exposure control, adjustment of the mA and/or kV according to patient size and/or use of iterative reconstruction technique. COMPARISON:  05/11/2022 FINDINGS: Brain: No evidence of acute infarction, hemorrhage, hydrocephalus, extra-axial collection or mass lesion/mass effect. Remote infarct in the left cerebellar hemisphere There is moderate diffuse low-attenuation within the subcortical and periventricular white matter compatible with chronic microvascular disease. Prominence of the sulci and ventricles compatible with brain atrophy. Vascular: No hyperdense vessel or unexpected calcification. Skull: Normal. Negative for fracture or focal lesion. Sinuses/Orbits: Mild fluid identified within the dependent portion of the right maxillary sinus. The remaining sinuses are clear. Other: None IMPRESSION: 1. No acute intracranial abnormalities. 2. Chronic microvascular disease and brain atrophy. 3. Remote infarct in the left cerebellar hemisphere. Electronically Signed   By: Signa Kell M.D.   On: 04/09/2023 16:10    DISCHARGE EXAMINATION: See progress note from earlier today  DISPOSITION: Home with family  Discharge Instructions     Ambulatory referral to Neurology   Complete by: As directed    An appointment is requested in approximately: 4 weeks for cognitive  testing and concern for TIA   Ambulatory referral to Physical Therapy   Complete by: As directed  Physical deconditioning/ataxia   Call MD for:  difficulty breathing, headache or visual disturbances   Complete by: As directed    Call MD for:  extreme fatigue   Complete by: As directed    Call MD for:  persistant dizziness or light-headedness   Complete by: As directed    Call MD for:  persistant nausea and vomiting   Complete by: As directed    Call MD for:  severe uncontrolled pain   Complete by: As directed    Call MD for:  temperature >100.4   Complete by: As directed    Diet - low sodium heart healthy   Complete by: As directed    Diet Carb Modified   Complete by: As directed    Discharge instructions   Complete by: As directed    Take your medications as prescribed. A referral has been sent to the neurologist for outpatient follow up.  You were cared for by a hospitalist during your hospital stay. If you have any questions about your discharge medications or the care you received while you were in the hospital after you are discharged, you can call the unit and asked to speak with the hospitalist on call if the hospitalist that took care of you is not available. Once you are discharged, your primary care physician will handle any further medical issues. Please note that NO REFILLS for any discharge medications will be authorized once you are discharged, as it is imperative that you return to your primary care physician (or establish a relationship with a primary care physician if you do not have one) for your aftercare needs so that they can reassess your need for medications and monitor your lab values. If you do not have a primary care physician, you can call [phone] for a physician referral.   Increase activity slowly   Complete by: As directed          Allergies as of 05/06/2023   No Known Allergies      Medication List     STOP taking these medications     benzonatate 100 MG capsule Commonly known as: TESSALON   metoCLOPramide 10 MG tablet Commonly known as: Reglan   oseltamivir 30 MG capsule Commonly known as: Tamiflu       TAKE these medications    Accu-Chek Softclix Lancets lancets   aspirin EC 81 MG tablet Take 1 tablet (81 mg total) by mouth at bedtime for 21 days. For 3 weeks only. Swallow whole. What changed: additional instructions   BD Pen Needle Nano U/F 32G X 4 MM Misc Generic drug: Insulin Pen Needle See admin instructions.   Breztri Aerosphere 160-9-4.8 MCG/ACT Aero Generic drug: budeson-glycopyrrolate-formoterol Inhale 2 puffs into the lungs at bedtime.   clopidogrel 75 MG tablet Commonly known as: PLAVIX Take 1 tablet (75 mg total) by mouth daily.   ferrous sulfate 325 (65 FE) MG tablet Take 325 mg by mouth daily with breakfast.   Janumet 50-1000 MG tablet Generic drug: sitaGLIPtin-metformin Take 1 tablet by mouth 2 (two) times daily.   lisinopril-hydrochlorothiazide 20-12.5 MG tablet Commonly known as: ZESTORETIC Take 1 tablet by mouth daily.   mirtazapine 7.5 MG tablet Commonly known as: REMERON Take 7.5 mg by mouth at bedtime.   multivitamin with minerals Tabs tablet Take 1 tablet by mouth daily.   ONE TOUCH ULTRA TEST test strip Generic drug: glucose blood   pioglitazone 15 MG tablet Commonly known as: ACTOS Take 15 mg by mouth daily.  rosuvastatin 20 MG tablet Commonly known as: CRESTOR Take 1 tablet (20 mg total) by mouth daily. What changed:  medication strength how much to take   Toujeo SoloStar 300 UNIT/ML Solostar Pen Generic drug: insulin glargine (1 Unit Dial) Inject 30 Units into the skin at bedtime.           TOTAL DISCHARGE TIME: 35 minutes  Etheline Geppert [employer] on [email or web address]  05/07/2023, 11:42 AM

## 2023-05-06 NOTE — Consult Note (Signed)
 Stroke Neurology Consultation Note  Consult Requested by: Dr. Rito Ehrlich  Reason for Consult: AMS  Consult Date: 05/06/23   The history was obtained from the daughter and chart.  During history and examination, all items were able to obtain unless otherwise noted.  History of Present Illness:  Jeffrey Stevens is a 73 y.o. African American male with PMH of HTN, HLD and DM admitted for an episode of confusion.   Per daughter, pt at baseline lives with roommate, still works everyday and attending ADLs by himself. Thursday 3/13 night, he was in dinner after work, then he stated that he needed to go to bathroom. However, instead of going to the bathroom, he went to another table and peed on the table. He apparently confused and not knowing where he was. Roommate called daughter and daughter had to clean him and change clothes for him but he continued to be confused and not recognizing daughter and put his shirt to his feet. Also pointed to his feet asking his daughter what they were. He also had whole body weakness and daughter can not get him up to walk. He was sent to [facility] ER and then transfer to [facility]. Daughter said his mental status gradually improved in ER and seems back to baseline now although still had confusion and delirum last night here in [facility]. Daughter stated that his balance seems getting worse after this event.   Per daughter, pt does have some memory issues from time to time for the last several months. He also had some balance issues for the last several months, and getting worse over time, with more frequent falls.   He had HA with blurry vision and presented to ED in 03/2020, MRI negative for acute infarct but showed chronic left small cerebellar infarct but pt declined any stroke hx in the past or any stroke symptoms in the past.    Past Medical History:  Diagnosis Date   Diabetes mellitus without complication (HCC)    Dyslipidemia    Hypertension    Pneumonia 05/23/2014    Past  Surgical History:  Procedure Laterality Date   COLONOSCOPY  2015    Family History  Problem Relation Age of Onset   Diabetes Mother    Diabetes Father     Social History:  reports that he has been smoking cigarettes. He has a 50 pack-year smoking history. He has never used smokeless tobacco. He reports that he does not drink alcohol and does not use drugs.  Allergies: No Known Allergies  No current facility-administered medications on file prior to encounter.   Current Outpatient Medications on File Prior to Encounter  Medication Sig Dispense Refill   ACCU-CHEK SOFTCLIX LANCETS lancets      aspirin EC 81 MG tablet Take 81 mg by mouth at bedtime. Swallow whole.     BD PEN NEEDLE NANO U/F 32G X 4 MM MISC See admin instructions.  4   Budeson-Glycopyrrol-Formoterol (BREZTRI AEROSPHERE) 160-9-4.8 MCG/ACT AERO Inhale 2 puffs into the lungs at bedtime.     ferrous sulfate 325 (65 FE) MG tablet Take 325 mg by mouth daily with breakfast.     insulin glargine, 1 Unit Dial, (TOUJEO SOLOSTAR) 300 UNIT/ML Solostar Pen Inject 30 Units into the skin at bedtime.     JANUMET 50-1000 MG tablet Take 1 tablet by mouth 2 (two) times daily.     lisinopril-hydrochlorothiazide (PRINZIDE,ZESTORETIC) 20-12.5 MG per tablet Take 1 tablet by mouth daily. 30 tablet 0   mirtazapine (REMERON) 7.5  MG tablet Take 7.5 mg by mouth at bedtime.     Multiple Vitamin (MULTIVITAMIN WITH MINERALS) TABS tablet Take 1 tablet by mouth daily.     ONE TOUCH ULTRA TEST test strip      pioglitazone (ACTOS) 15 MG tablet Take 15 mg by mouth daily.     rosuvastatin (CRESTOR) 10 MG tablet Take 10 mg by mouth daily.  1   benzonatate (TESSALON) 100 MG capsule Take 1 capsule (100 mg total) by mouth every 8 (eight) hours. (Patient not taking: Reported on 05/05/2023) 21 capsule 0   metoCLOPramide (REGLAN) 10 MG tablet Take 1 tablet (10 mg total) by mouth every 6 (six) hours as needed for nausea (nausea/headache). (Patient not taking:  Reported on 05/05/2023) 10 tablet 0   oseltamivir (TAMIFLU) 30 MG capsule Take 1 capsule (30 mg total) by mouth 2 (two) times daily. (Patient not taking: Reported on 05/05/2023) 10 capsule 0    Review of Systems: A full ROS was attempted today and was able to be performed.  Systems assessed include - Constitutional, Eyes, HENT, Respiratory, Cardiovascular, Gastrointestinal, Genitourinary, Integument/breast, Hematologic/lymphatic, Musculoskeletal, Neurological, Behavioral/Psych, Endocrine, Allergic/Immunologic - with pertinent responses as per HPI.  Physical Examination: Temp:  [97.7 F (36.5 C)-98.6 F (37 C)] 98.3 F (36.8 C) (03/15 1207) Pulse Rate:  [57-73] 61 (03/15 1207) Resp:  [17-18] 17 (03/15 1207) BP: (118-152)/(64-104) 129/72 (03/15 1207) SpO2:  [99 %-100 %] 100 % (03/15 1207)  General - well nourished, well developed, in no apparent distress.    Ophthalmologic - fundi not visualized due to noncooperation.    Cardiovascular - regular rhythm and rate  Mental Status -  Level of arousal and orientation to time, place, and person were intact. Language including expression, naming, repetition, comprehension was assessed and found intact. Fund of Knowledge was assessed and was intact.  Cranial Nerves II - XII - II - Vision intact OU. III, IV, VI - Extraocular movements intact. V - Facial sensation intact bilaterally. VII - Facial movement intact bilaterally. VIII - Hearing & vestibular intact bilaterally. X - Palate elevates symmetrically. XI - Chin turning & shoulder shrug intact bilaterally. XII - Tongue protrusion intact.  Motor Strength - The patient's strength was normal in all extremities and pronator drift was absent.   Motor Tone & Bulk - Muscle tone was assessed at the neck and appendages and was normal.  Bulk was normal and fasciculations were absent.   Reflexes - The patient's reflexes were 1+ in all extremities and he had no pathological reflexes. No ankle  clonus.  Sensory - Light touch, temperature/pinprick were assessed and were normal.    Coordination - The patient had normal movements in the hands and feet with no ataxia or dysmetria.  Tremor was absent.  Gait and Station - deferred  Data Reviewed: MR LUMBAR SPINE WO CONTRAST Result Date: 05/06/2023 CLINICAL DATA:  Lumbar radiculopathy, symptoms persist with greater than 6 weeks of treatment. EXAM: MRI LUMBAR SPINE WITHOUT CONTRAST TECHNIQUE: Multiplanar, multisequence MR imaging of the lumbar spine was performed. No intravenous contrast was administered. COMPARISON:  None Available. FINDINGS: Only partial examination achieved because of limited patient tolerance. Segmentation: 5 lumbar type vertebral bodies assumed. Alignment:  Degenerative anterolisthesis at L4-5 of 3 mm. Vertebrae: No fracture or focal bone lesion. Edematous change of the facet joints at L4-5. Conus medullaris and cauda equina: Conus extends to the L1-2 level. Distal cord and conus appear unremarkable. Paraspinal and other soft tissues: Negative Disc levels: No disc level abnormality  from T10-11 through L3-4. L4-5: Bilateral facet arthropathy with degenerative anterolisthesis of 3 mm. Bulging of the disc. Stenosis of both lateral recesses. Foraminal narrowing left more than right. Neural compression or irritation could occur at this level. The facet arthritis would likely be a cause of back pain or referred facet syndrome pain. L5-S1: Disc degeneration with shallow protrusion. Facet degeneration and hypertrophy. No central canal stenosis. Narrowing of the subarticular lateral recesses and neural foramina that could be symptomatic. The facet arthritis could be painful. IMPRESSION: 1. Only partial examination achieved because of limited patient tolerance. 2. L4-5: Bilateral facet arthropathy with degenerative anterolisthesis of 3 mm. Bulging of the disc. Stenosis of both lateral recesses. Foraminal narrowing left more than right. Neural  compression or irritation could occur at this level. The facet arthritis would likely be a cause of back pain or referred facet syndrome pain. 3. L5-S1: Disc degeneration with shallow protrusion. Facet degeneration and hypertrophy. Narrowing of the subarticular lateral recesses and neural foramina that could be symptomatic. The facet arthritis could be painful. Electronically Signed   By: Paulina Fusi M.D.   On: 05/06/2023 14:56   MR THORACIC SPINE WO CONTRAST Result Date: 05/06/2023 CLINICAL DATA:  Ataxia.  Thoracic pathology suspected. EXAM: MRI THORACIC SPINE WITHOUT CONTRAST TECHNIQUE: Multiplanar, multisequence MR imaging of the thoracic spine was performed. No intravenous contrast was administered. COMPARISON:  Cervical study same day. FINDINGS: The thoracic examination is severely degraded by motion. Alignment: No malalignment. Vertebrae: No fracture or focal bone lesion. Cord: Better shown on the cervical exam, there is abnormal T2 signal within the central cord at the T2 level. Below that, cord detail as shown on this thoracic exam is inadequate for evaluation because of the motion degradation. The abnormality at the T2 level could well be due to small-vessel ischemic change in this patient with a history of diabetes and hypertension. Paraspinal and other soft tissues: Otherwise negative Disc levels: No evidence of disc level pathology. No compressive narrowing of the canal or foramina. IMPRESSION: 1. Severely motion degraded exam. 2. Abnormal T2 signal within the central cord at the T2 level. This is better shown on the cervical exam. Below that, cord detail as shown on this thoracic exam is inadequate for evaluation because of the motion degradation. The abnormality at the T2 level could well be due to small-vessel ischemic change in this patient with a history of diabetes and hypertension. Electronically Signed   By: Paulina Fusi M.D.   On: 05/06/2023 14:54   MR CERVICAL SPINE WO CONTRAST Result  Date: 05/06/2023 CLINICAL DATA:  Ataxia.  Cervical pathology suspected. EXAM: MRI CERVICAL SPINE WITHOUT CONTRAST TECHNIQUE: Multiplanar, multisequence MR imaging of the cervical spine was performed. No intravenous contrast was administered. COMPARISON:  None Available. FINDINGS: Alignment: No malalignment. Straightening of the normal cervical lordosis. Vertebrae: No fracture or focal bone lesion. No edematous endplate marrow changes or advanced facet arthritis. Cord: No cervical cord compression or abnormal cord signal in the cervical region. Posterior Fossa, vertebral arteries, paraspinal tissues: Negative Disc levels: No significant disc level pathology at C5-6 or above. No compressive narrowing of the canal or foramina. C6-7: Endplate osteophytes and bulging of the disc. Moderate bilateral foraminal stenosis. C7-T1: Endplate osteophytes and bulging of the disc. Moderate bilateral foraminal stenosis. IMPRESSION: 1. No abnormality of the cervical cord. 2. C6-7: Moderate bilateral foraminal stenosis. 3. C7-T1: Moderate bilateral foraminal stenosis. Electronically Signed   By: Paulina Fusi M.D.   On: 05/06/2023 14:49   EEG adult  Result Date: 05/05/2023 Charlsie Quest, MD     05/05/2023 11:03 PM Patient Name: LEONTAE BOSTOCK MRN: [id number] Epilepsy Attending: Charlsie Quest Referring Physician/Provider: Osvaldo Shipper, MD Date: 05/05/2023 Duration: 27.17 mins Patient history: 73yo M with transient confusion. EEG to evaluate for seizure Level of alertness: Awake AEDs during EEG study: None Technical aspects: This EEG study was done with scalp electrodes positioned according to the 10-20 International system of electrode placement. Electrical activity was reviewed with band pass filter of 1-70Hz , sensitivity of 7 uV/mm, display speed of 39mm/sec with a 60Hz  notched filter applied as appropriate. EEG data were recorded continuously and digitally stored.  Video monitoring was available and reviewed as  appropriate. Description: The posterior dominant rhythm consists of 8 Hz activity of moderate voltage (25-35 uV) seen predominantly in posterior head regions, symmetric and reactive to eye opening and eye closing. Physiologic photic driving was not seen during photic stimulation.  Hyperventilation was not performed.   IMPRESSION: This study is within normal limits. No seizures or epileptiform discharges were seen throughout the recording. A normal interictal EEG does not exclude the diagnosis of epilepsy. Charlsie Quest   MR BRAIN WO CONTRAST Result Date: 05/05/2023 CLINICAL DATA:  Altered mental status EXAM: MRI HEAD WITHOUT CONTRAST TECHNIQUE: Multiplanar, multiecho pulse sequences of the brain and surrounding structures were obtained without intravenous contrast. COMPARISON:  None Available. FINDINGS: Brain: No acute infarct, mass effect or extra-axial collection. No acute or chronic hemorrhage. There is multifocal hyperintense T2-weighted signal within the white matter. Generalized volume loss. Old left cerebellar infarct. The midline structures are normal. Vascular: Normal flow voids. Skull and upper cervical spine: Normal calvarium and skull base. Visualized upper cervical spine and soft tissues are normal. Sinuses/Orbits:No paranasal sinus fluid levels or advanced mucosal thickening. No mastoid or middle ear effusion. Normal orbits. IMPRESSION: 1. No acute intracranial abnormality. 2. 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 Electronically Signed   By: Deatra Robinson M.D.   On: 05/05/2023 20:53   CT ANGIO HEAD NECK W WO CM (CODE STROKE) Result Date: 05/04/2023 CLINICAL DATA:  Acute neurologic deficit EXAM: CT ANGIOGRAPHY HEAD AND NECK WITH AND WITHOUT CONTRAST TECHNIQUE: Multidetector CT imaging of the head and neck was performed using the standard protocol during bolus administration of intravenous contrast. Multiplanar CT image reconstructions and MIPs were obtained  to evaluate the vascular anatomy. Carotid stenosis measurements (when applicable) are obtained utilizing NASCET criteria, using the distal internal carotid diameter as the denominator. RADIATION DOSE REDUCTION: This exam was performed according to the departmental dose-optimization program which includes automated exposure control, adjustment of the mA and/or kV according to patient size and/or use of iterative reconstruction technique. CONTRAST:  75mL OMNIPAQUE IOHEXOL 350 MG/ML SOLN COMPARISON:  None Available. FINDINGS: CTA NECK FINDINGS Skeleton: No acute abnormality or high grade bony spinal canal stenosis. Other neck: Normal pharynx, larynx and major salivary glands. No cervical lymphadenopathy. Unremarkable thyroid gland. Upper chest: Biapical emphysema. Aortic arch: There is no calcific atherosclerosis of the aortic arch. Conventional 3 vessel aortic branching pattern. RIGHT carotid system: Normal without aneurysm, dissection or stenosis. LEFT carotid system: Normal without aneurysm, dissection or stenosis. Vertebral arteries: Codominant configuration. There is no dissection, occlusion or flow-limiting stenosis to the skull base (V1-V3 segments). CTA HEAD FINDINGS POSTERIOR CIRCULATION: Vertebral arteries are normal. No proximal occlusion of the anterior or inferior cerebellar arteries. Basilar artery is normal. Superior cerebellar arteries are normal. Posterior cerebral arteries are normal. ANTERIOR CIRCULATION: Atherosclerotic  calcification of the internal carotid arteries at the skull base without hemodynamically significant stenosis. Anterior cerebral arteries are normal. Middle cerebral arteries are normal. Venous sinuses: As permitted by contrast timing, patent. Anatomic variants: None Review of the MIP images confirms the above findings. IMPRESSION: 1. No emergent large vessel occlusion or hemodynamically significant stenosis of the head or neck. Electronically Signed   By: Deatra Robinson M.D.   On:  05/04/2023 22:17   CT HEAD CODE STROKE WO CONTRAST Result Date: 05/04/2023 CLINICAL DATA:  Code stroke.  Acute neurologic deficit EXAM: CT HEAD WITHOUT CONTRAST TECHNIQUE: Contiguous axial images were obtained from the base of the skull through the vertex without intravenous contrast. RADIATION DOSE REDUCTION: This exam was performed according to the departmental dose-optimization program which includes automated exposure control, adjustment of the mA and/or kV according to patient size and/or use of iterative reconstruction technique. COMPARISON:  None Available. FINDINGS: Brain: There is no mass, hemorrhage or extra-axial collection. The size and configuration of the ventricles and extra-axial CSF spaces are normal. There is hypoattenuation of the periventricular white matter, most commonly indicating chronic ischemic microangiopathy. Old left cerebellar infarct. Vascular: No abnormal hyperdensity of the major intracranial arteries or dural venous sinuses. No intracranial atherosclerosis. Skull: The visualized skull base, calvarium and extracranial soft tissues are normal. Sinuses/Orbits: No fluid levels or advanced mucosal thickening of the visualized paranasal sinuses. No mastoid or middle ear effusion. The orbits are normal. ASPECTS (Alberta Stroke Program Early CT Score) - Ganglionic level infarction (caudate, lentiform nuclei, internal capsule, insula, M1-M3 cortex): 7 - Supraganglionic infarction (M4-M6 cortex): 3 Total score (0-10 with 10 being normal): 10 Extended scout image shows no metallic object in the head, neck, chest, abdomen or pelvis that would be a contraindication to MRI. IMPRESSION: 1. No acute intracranial abnormality. 2. ASPECTS is 10. 3. 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 These results were called by telephone at the time of interpretation on 05/04/2023 at 9:47 pm to provider Vanetta Mulders , who verbally acknowledged these results. Electronically Signed    By: Deatra Robinson M.D.   On: 05/04/2023 21:49   DG Chest Portable 1 View Result Date: 04/09/2023 CLINICAL DATA:  Cough.  Dizziness.  Blurry vision. EXAM: PORTABLE CHEST 1 VIEW COMPARISON:  08/07/2019. FINDINGS: Low lung volume. Bilateral lung fields are clear. Bilateral costophrenic angles are clear. Normal cardio-mediastinal silhouette. No acute osseous abnormalities. The soft tissues are within normal limits. IMPRESSION: No active disease. Electronically Signed   By: Jules Schick M.D.   On: 04/09/2023 17:13   CT Head Wo Contrast Result Date: 04/09/2023 CLINICAL DATA:  Headache.  Dizziness and blurry vision. EXAM: CT HEAD WITHOUT CONTRAST TECHNIQUE: Contiguous axial images were obtained from the base of the skull through the vertex without intravenous contrast. RADIATION DOSE REDUCTION: This exam was performed according to the departmental dose-optimization program which includes automated exposure control, adjustment of the mA and/or kV according to patient size and/or use of iterative reconstruction technique. COMPARISON:  05/11/2022 FINDINGS: Brain: No evidence of acute infarction, hemorrhage, hydrocephalus, extra-axial collection or mass lesion/mass effect. Remote infarct in the left cerebellar hemisphere There is moderate diffuse low-attenuation within the subcortical and periventricular white matter compatible with chronic microvascular disease. Prominence of the sulci and ventricles compatible with brain atrophy. Vascular: No hyperdense vessel or unexpected calcification. Skull: Normal. Negative for fracture or focal lesion. Sinuses/Orbits: Mild fluid identified within the dependent portion of the right maxillary sinus. The remaining sinuses are clear. Other: None IMPRESSION:  1. No acute intracranial abnormalities. 2. Chronic microvascular disease and brain atrophy. 3. Remote infarct in the left cerebellar hemisphere. Electronically Signed   By: Signa Kell M.D.   On: 04/09/2023 16:10     Assessment: 73 y.o. male with PMH of HTN, HLD and DM admitted for an episode of confusion and whole body weakness episode. The episode by description from daughter more like confusion, delirium event, and currently pt back to baseline except one sundowning episode last night and worsening imbalance per daughter.  CT and MRI showed no acute infarct but old left small cerebral infarct.  CTA head and neck negative.  EEG negative.  2D echo pending.  LDL 78, A1c 8.1.  UDS negative.   Etiology for patient episode not quite clear, could be sundowning/delirium versus TIA versus encephalopathy.  Currently pending 2D echo, and spine MRI for further workup.  PT and OT recommend all patient therapy.  Will continue DAPT for 3 weeks and then Plavix alone, and continue statin.  Recommend outpatient speech therapy for cognitive testing, outpatient follow-up with neurology for cognitive impairment.  Stroke Risk Factors - diabetes mellitus, hyperlipidemia, and hypertension  Plan: -Pending MRI C/T/L-spine - Echocardiogram pending - DAPT for 3 weeks and then Plavix alone - continue statin - outpatient speech therapy for cognitive testing - outpatient follow-up with neurology  - Risk factor modification - will follow  Thank you for this consultation and allowing [place] to participate in the care of this patient.  Marvel Plan, MD PhD Stroke Neurology 05/06/2023 3:35 PM

## 2023-05-06 NOTE — Plan of Care (Signed)
  Problem: Acute Rehab PT Goals(only PT should resolve) Goal: Patient Will Transfer Sit To/From Stand Flowsheets (Taken 05/06/2023 1214) Patient will transfer sit to/from stand: with supervision Goal: Pt Will Ambulate Flowsheets (Taken 05/06/2023 1214) Pt will Ambulate:  > 125 feet  with supervision Goal: Pt Will Go Up/Down Stairs Flowsheets (Taken 05/06/2023 1214) Pt will Go Up / Down Stairs:  1-2 stairs  with supervision  with rail(s)

## 2023-05-06 NOTE — Evaluation (Signed)
 Occupational Therapy Evaluation Patient Details Name: Jeffrey Stevens MRN: [id number] DOB: Dec 13, 1950 Today's Date: 05/06/2023   History of Present Illness   73 y/o male presents to [facility] on 3/14 with sudden confusion and memory loss. CT negative for any acute abnormalities. PMH includes history of insulin-dependent diabetes, essential hypertension, and hyperlipidemia.     Clinical Impressions PTA pt reports being ind in ADLs/iADLs, continues to work and drives, ambulates without AD. Pt currently presenting with impaired balance with a posterior bias impacting his ability to ambulate and safely complete ADLs/iADLs. Pt cognition overall WFL, no praxis deficits noted. OT to continue following pt acutely to address listed deficits and help transition to next level of care. Anticipate no post acute OT needs pending pt progression in acute setting.      If plan is discharge home, recommend the following:   Help with stairs or ramp for entrance;A little help with bathing/dressing/bathroom;Supervision due to cognitive status     Functional Status Assessment   Patient has had a recent decline in their functional status and demonstrates the ability to make significant improvements in function in a reasonable and predictable amount of time.     Equipment Recommendations   None recommended by OT     Recommendations for Other Services         Precautions/Restrictions   Precautions Precautions: Fall Recall of Precautions/Restrictions: Intact Restrictions Weight Bearing Restrictions Per Provider Order: No     Mobility Bed Mobility Overal bed mobility: Needs Assistance Bed Mobility: Supine to Sit     Supine to sit: Supervision, Used rails          Transfers Overall transfer level: Needs assistance Equipment used: None Transfers: Sit to/from Stand Sit to Stand: Contact guard assist           General transfer comment: STSx2 from EOB      Balance Overall balance  assessment: History of Falls, Needs assistance Sitting-balance support: Feet supported Sitting balance-Leahy Scale: Normal     Standing balance support: No upper extremity supported Standing balance-Leahy Scale: Fair Standing balance comment: Unsteadiness when initially standing up from a seated position                           ADL either performed or assessed with clinical judgement   ADL Overall ADL's : Needs assistance/impaired Eating/Feeding: Independent;Sitting   Grooming: Wash/dry hands;Oral care;Standing;Minimal assistance Grooming Details (indicate cue type and reason): Min A to maintain standing balance Upper Body Bathing: Sitting;Set up   Lower Body Bathing: Sitting/lateral leans;Contact guard assist   Upper Body Dressing : Sitting;Set up   Lower Body Dressing: Sitting/lateral leans;Contact guard assist Lower Body Dressing Details (indicate cue type and reason): doff/don bilat socks, posterior lean Toilet Transfer: Minimal assistance;Ambulation   Toileting- Clothing Manipulation and Hygiene: Sit to/from stand;Minimal assistance       Functional mobility during ADLs: Minimal assistance General ADL Comments: Pt progressing to CGA no AD during session.     Vision Baseline Vision/History: 0 No visual deficits Patient Visual Report: No change from baseline Vision Assessment?: Yes Eye Alignment: Within Functional Limits Ocular Range of Motion: Within Functional Limits Alignment/Gaze Preference: Within Defined Limits Tracking/Visual Pursuits: Able to track stimulus in all quads without difficulty Convergence: Within functional limits Visual Fields: No apparent deficits     Perception Perception: Within Functional Limits       Praxis Praxis: WFL       Pertinent Vitals/Pain Pain Assessment Pain  Assessment: No/denies pain     Extremity/Trunk Assessment Upper Extremity Assessment Upper Extremity Assessment: Overall WFL for tasks assessed    Lower Extremity Assessment Lower Extremity Assessment: Overall WFL for tasks assessed   Cervical / Trunk Assessment Cervical / Trunk Assessment: Normal   Communication Communication Communication: No apparent difficulties   Cognition Arousal: Alert Behavior During Therapy: WFL for tasks assessed/performed Cognition: No apparent impairments                               Following commands: Intact       Cueing  General Comments   Cueing Techniques: Verbal cues;Gestural cues  Pt c/o dizzness when initially standing, VSS on RA. 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      Prior Functioning/Environment Prior Level of Function : Independent/Modified Independent;Working/employed;Driving;History of Falls (last six months)             Mobility Comments: Ind no AD ADLs Comments: ind    OT Problem List: Impaired balance (sitting and/or standing)   OT Treatment/Interventions: Self-care/ADL training;Balance training;Therapeutic exercise;Therapeutic activities;Patient/family education;DME and/or AE instruction      OT Goals(Current goals can be found in the care plan section)   Acute Rehab OT Goals Patient Stated Goal: To go home OT Goal Formulation: With patient Time For Goal Achievement: 05/20/23 Potential to Achieve Goals: Good ADL Goals Pt Will Perform Grooming: with  modified independence;standing Pt Will Perform Lower Body Bathing: with modified independence;sit to/from stand Pt Will Perform Lower Body Dressing: with modified independence;sit to/from stand Pt Will Transfer to Toilet: with modified independence;ambulating Pt Will Perform Tub/Shower Transfer: with modified independence;Tub transfer   OT Frequency:  Min 2X/week    Co-evaluation              AM-PAC OT "6 Clicks" Daily Activity     Outcome Measure Help from another person eating meals?: None Help from another person taking care of personal grooming?: A Little Help from another person toileting, which includes using toliet, bedpan, or urinal?: A Little Help from another person bathing (including washing, rinsing, drying)?: A Little Help from another person to put on and taking off regular upper body clothing?: A Little Help from another person to put on and taking off regular lower body clothing?: A Little 6 Click Score: 19   End of Session Equipment Utilized During Treatment: Gait belt Nurse Communication: Mobility status  Activity Tolerance: Patient tolerated treatment well Patient left: in chair;with call bell/phone within reach;with chair alarm set  OT Visit Diagnosis: Unsteadiness on feet (R26.81);Other abnormalities of gait and mobility (R26.89);History of falling (Z91.81)                Time: 1610-9604 OT Time Calculation (min): 37 min Charges:  OT General Charges $OT Visit: 1 Visit OT Evaluation $OT Eval Low Complexity: 1 Low OT Treatments $Self Care/Home Management : 8-22 mins  05/06/2023  AB, OTR/L  Acute Rehabilitation Services  Office: [phone]   Tristan Schroeder 05/06/2023, 11:34 AM

## 2023-05-06 NOTE — Plan of Care (Signed)
 Pt and family are active in pt care. Pt family continues to reinforce care plan goal with patient.

## 2023-05-06 NOTE — Progress Notes (Signed)
 Echocardiogram 2D Echocardiogram has been performed.  Lucendia Herrlich 05/06/2023, 3:59 PM

## 2023-05-06 NOTE — Care Management Obs Status (Signed)
 MEDICARE OBSERVATION STATUS NOTIFICATION   Patient Details  Name: Jeffrey Stevens MRN: [id number] Date of Birth: 1950-03-27   Medicare Observation Status Notification Given:  Yes    Moroni Nester G., RN 05/06/2023, 3:16 PM

## 2023-05-06 NOTE — Plan of Care (Signed)
 Problem: Education: Goal: Knowledge of General Education information will improve Description: Including pain rating scale, medication(s)/side effects and non-pharmacologic comfort measures 05/06/2023 1703 by Juluis Mire, RN Outcome: Adequate for Discharge 05/06/2023 6213 by Juluis Mire, RN Outcome: Progressing   Problem: Health Behavior/Discharge Planning: Goal: Ability to manage health-related needs will improve 05/06/2023 1703 by Juluis Mire, RN Outcome: Adequate for Discharge 05/06/2023 0865 by Juluis Mire, RN Outcome: Progressing   Problem: Clinical Measurements: Goal: Ability to maintain clinical measurements within normal limits will improve 05/06/2023 1703 by Juluis Mire, RN Outcome: Adequate for Discharge 05/06/2023 7846 by Juluis Mire, RN Outcome: Progressing Goal: Will remain free from infection 05/06/2023 1703 by Juluis Mire, RN Outcome: Adequate for Discharge 05/06/2023 0904 by Juluis Mire, RN Outcome: Progressing Goal: Diagnostic test results will improve 05/06/2023 1703 by Juluis Mire, RN Outcome: Adequate for Discharge 05/06/2023 9629 by Juluis Mire, RN Outcome: Progressing Goal: Respiratory complications will improve 05/06/2023 1703 by Juluis Mire, RN Outcome: Adequate for Discharge 05/06/2023 5284 by Juluis Mire, RN Outcome: Progressing Goal: Cardiovascular complication will be avoided 05/06/2023 1703 by Juluis Mire, RN Outcome: Adequate for Discharge 05/06/2023 1324 by Juluis Mire, RN Outcome: Progressing   Problem: Activity: Goal: Risk for activity intolerance will decrease 05/06/2023 1703 by Juluis Mire, RN Outcome: Adequate for Discharge 05/06/2023 4010 by Juluis Mire, RN Outcome: Progressing   Problem: Nutrition: Goal: Adequate nutrition will be maintained 05/06/2023 1703 by Juluis Mire, RN Outcome: Adequate for  Discharge 05/06/2023 0904 by Juluis Mire, RN Outcome: Progressing   Problem: Coping: Goal: Level of anxiety will decrease 05/06/2023 1703 by Juluis Mire, RN Outcome: Adequate for Discharge 05/06/2023 2725 by Juluis Mire, RN Outcome: Progressing   Problem: Elimination: Goal: Will not experience complications related to bowel motility 05/06/2023 1703 by Juluis Mire, RN Outcome: Adequate for Discharge 05/06/2023 3664 by Juluis Mire, RN Outcome: Progressing Goal: Will not experience complications related to urinary retention 05/06/2023 1703 by Juluis Mire, RN Outcome: Adequate for Discharge 05/06/2023 4034 by Juluis Mire, RN Outcome: Progressing   Problem: Pain Managment: Goal: General experience of comfort will improve and/or be controlled 05/06/2023 1703 by Juluis Mire, RN Outcome: Adequate for Discharge 05/06/2023 7425 by Juluis Mire, RN Outcome: Progressing   Problem: Safety: Goal: Ability to remain free from injury will improve 05/06/2023 1703 by Juluis Mire, RN Outcome: Adequate for Discharge 05/06/2023 9563 by Juluis Mire, RN Outcome: Progressing   Problem: Skin Integrity: Goal: Risk for impaired skin integrity will decrease 05/06/2023 1703 by Juluis Mire, RN Outcome: Adequate for Discharge 05/06/2023 8756 by Juluis Mire, RN Outcome: Progressing   Problem: Education: Goal: Ability to describe self-care measures that may prevent or decrease complications (Diabetes Survival Skills Education) will improve 05/06/2023 1703 by Juluis Mire, RN Outcome: Adequate for Discharge 05/06/2023 0904 by Juluis Mire, RN Outcome: Progressing Goal: Individualized Educational Video(s) 05/06/2023 1703 by Juluis Mire, RN Outcome: Adequate for Discharge 05/06/2023 [phone] by Juluis Mire, RN Outcome: Progressing   Problem: Coping: Goal: Ability to adjust to condition  or change in health will improve 05/06/2023 1703 by Juluis Mire, RN Outcome: Adequate for Discharge 05/06/2023 9518 by Juluis Mire, RN Outcome: Progressing   Problem: Fluid Volume: Goal: Ability to maintain a balanced intake and output will improve 05/06/2023 1703 by Juluis Mire, RN Outcome: Adequate for Discharge 05/06/2023  9147 by Juluis Mire, RN Outcome: Progressing   Problem: Health Behavior/Discharge Planning: Goal: Ability to identify and utilize available resources and services will improve 05/06/2023 1703 by Juluis Mire, RN Outcome: Adequate for Discharge 05/06/2023 8295 by Juluis Mire, RN Outcome: Progressing Goal: Ability to manage health-related needs will improve 05/06/2023 1703 by Juluis Mire, RN Outcome: Adequate for Discharge 05/06/2023 6213 by Juluis Mire, RN Outcome: Progressing   Problem: Metabolic: Goal: Ability to maintain appropriate glucose levels will improve 05/06/2023 1703 by Juluis Mire, RN Outcome: Adequate for Discharge 05/06/2023 0865 by Juluis Mire, RN Outcome: Progressing   Problem: Nutritional: Goal: Maintenance of adequate nutrition will improve 05/06/2023 1703 by Juluis Mire, RN Outcome: Adequate for Discharge 05/06/2023 7846 by Juluis Mire, RN Outcome: Progressing Goal: Progress toward achieving an optimal weight will improve 05/06/2023 1703 by Juluis Mire, RN Outcome: Adequate for Discharge 05/06/2023 9629 by Juluis Mire, RN Outcome: Progressing   Problem: Skin Integrity: Goal: Risk for impaired skin integrity will decrease 05/06/2023 1703 by Juluis Mire, RN Outcome: Adequate for Discharge 05/06/2023 5284 by Juluis Mire, RN Outcome: Progressing   Problem: Tissue Perfusion: Goal: Adequacy of tissue perfusion will improve 05/06/2023 1703 by Juluis Mire, RN Outcome: Adequate for Discharge 05/06/2023 1324 by  Juluis Mire, RN Outcome: Progressing   Problem: Education: Goal: Knowledge of disease or condition will improve 05/06/2023 1703 by Juluis Mire, RN Outcome: Adequate for Discharge 05/06/2023 4010 by Juluis Mire, RN Outcome: Progressing Goal: Knowledge of secondary prevention will improve (MUST DOCUMENT ALL) 05/06/2023 1703 by Juluis Mire, RN Outcome: Adequate for Discharge 05/06/2023 [phone] by Juluis Mire, RN Outcome: Progressing Goal: Knowledge of patient specific risk factors will improve (DELETE if not current risk factor) 05/06/2023 1703 by Juluis Mire, RN Outcome: Adequate for Discharge 05/06/2023 0904 by Juluis Mire, RN Outcome: Progressing   Problem: Ischemic Stroke/TIA Tissue Perfusion: Goal: Complications of ischemic stroke/TIA will be minimized 05/06/2023 1703 by Juluis Mire, RN Outcome: Adequate for Discharge 05/06/2023 0904 by Juluis Mire, RN Outcome: Progressing   Problem: Coping: Goal: Will verbalize positive feelings about self 05/06/2023 1703 by Juluis Mire, RN Outcome: Adequate for Discharge 05/06/2023 3664 by Juluis Mire, RN Outcome: Progressing Goal: Will identify appropriate support needs 05/06/2023 1703 by Juluis Mire, RN Outcome: Adequate for Discharge 05/06/2023 4034 by Juluis Mire, RN Outcome: Progressing   Problem: Health Behavior/Discharge Planning: Goal: Ability to manage health-related needs will improve 05/06/2023 1703 by Juluis Mire, RN Outcome: Adequate for Discharge 05/06/2023 7425 by Juluis Mire, RN Outcome: Progressing Goal: Goals will be collaboratively established with patient/family 05/06/2023 1703 by Juluis Mire, RN Outcome: Adequate for Discharge 05/06/2023 9563 by Juluis Mire, RN Outcome: Progressing   Problem: Self-Care: Goal: Ability to participate in self-care as condition permits will improve 05/06/2023  1703 by Juluis Mire, RN Outcome: Adequate for Discharge 05/06/2023 8756 by Juluis Mire, RN Outcome: Progressing Goal: Verbalization of feelings and concerns over difficulty with self-care will improve 05/06/2023 1703 by Juluis Mire, RN Outcome: Adequate for Discharge 05/06/2023 4332 by Juluis Mire, RN Outcome: Progressing Goal: Ability to communicate needs accurately will improve 05/06/2023 1703 by Juluis Mire, RN Outcome: Adequate for Discharge 05/06/2023 9518 by Juluis Mire, RN Outcome: Progressing   Problem: Nutrition: Goal: Risk of aspiration will decrease 05/06/2023 1703 by Juluis Mire, RN Outcome: Adequate for  Discharge 05/06/2023 0904 by Juluis Mire, RN Outcome: Progressing Goal: Dietary intake will improve 05/06/2023 1703 by Juluis Mire, RN Outcome: Adequate for Discharge 05/06/2023 4401 by Juluis Mire, RN Outcome: Progressing   Problem: Acute Rehab OT Goals (only OT should resolve) Goal: Pt. Will Perform Grooming Outcome: Adequate for Discharge Goal: Pt. Will Perform Lower Body Bathing Outcome: Adequate for Discharge Goal: Pt. Will Perform Lower Body Dressing Outcome: Adequate for Discharge Goal: Pt. Will Transfer To Toilet Outcome: Adequate for Discharge Goal: Pt. Will Perform Tub/Shower Transfer Outcome: Adequate for Discharge   Problem: Acute Rehab PT Goals(only PT should resolve) Goal: Patient Will Transfer Sit To/From Stand Outcome: Adequate for Discharge Goal: Pt Will Ambulate Outcome: Adequate for Discharge Goal: Pt Will Go Up/Down Stairs Outcome: Adequate for Discharge

## 2023-05-06 NOTE — Progress Notes (Signed)
 Patient BS 425. MD Rito Ehrlich notified and aware. Order placed for 20u novolog.

## 2023-05-06 NOTE — Progress Notes (Signed)
 Patient and family wanting patient to have home health upon discharge. MD Rito Ehrlich, case management, and social work notified. Per family, patient lives with one other person at home whom which he is the main caregiver and driver of the house. Family does not feel secure in sending patient home without further in home assistance. Due to patient being main driver, patient cannot safely get to outpatient PT and has no other person who is able to get him there.

## 2023-05-06 NOTE — Evaluation (Signed)
 Physical Therapy Evaluation Patient Details Name: Jeffrey Stevens MRN: [id number] DOB: 05/14/1950 Today's Date: 05/06/2023  History of Present Illness  73 y/o male presents to [facility] on 3/14 with sudden confusion and memory loss. CT negative for any acute abnormalities. PMH includes history of insulin-dependent diabetes, essential hypertension, and hyperlipidemia.  Clinical Impression  Patient lives at home with other relatives, is independent in ADLs/IADLs, drives and works. Patient presents with balance impairments statically and dynamically - demonstrating a posterior lean when initially standing and when ambulating. Patient requires CGA for transfers and when ambulating 118ft within the unit to ensure safety. Patient would benefit from OPPT to address his impaired balance and acute PT will continue to follow up to assist with transition to next level of care. Thank you for this consult.       If plan is discharge home, recommend the following: Assist for transportation;Help with stairs or ramp for entrance;A little help with walking and/or transfers   Can travel by private vehicle        Equipment Recommendations None recommended by PT  Recommendations for Other Services       Functional Status Assessment Patient has had a recent decline in their functional status and demonstrates the ability to make significant improvements in function in a reasonable and predictable amount of time.     Precautions / Restrictions Precautions Precautions: Fall Recall of Precautions/Restrictions: Intact Restrictions Weight Bearing Restrictions Per Provider Order: No      Mobility  Bed Mobility                    Transfers Overall transfer level: Needs assistance Equipment used: None Transfers: Sit to/from Stand Sit to Stand: Contact guard assist           General transfer comment: Able to use UE/LE support to push himself up from a seated position     Ambulation/Gait Ambulation/Gait assistance: Contact guard assist Gait Distance (Feet): 120 Feet Assistive device: None Gait Pattern/deviations: Decreased stride length, Leaning posteriorly Gait velocity: Decreased     General Gait Details: Posterior lean when ambulating, requries some cues to correct posture. Steady and cautious when ambulating  Stairs Stairs: Yes Stairs assistance: Contact guard assist Stair Management: One rail Right, Step to pattern, Forwards Number of Stairs: 2 General stair comments: Education on using tactile cues to negotiate steps with hands and feet  Wheelchair Mobility     Tilt Bed    Modified Rankin (Stroke Patients Only)       Balance Overall balance assessment: History of Falls, Needs assistance Sitting-balance support: Feet supported Sitting balance-Leahy Scale: Normal     Standing balance support: No upper extremity supported Standing balance-Leahy Scale: Fair Standing balance comment: Unsteadiness when initially standing up from a seated position                             Pertinent Vitals/Pain Pain Assessment Pain Assessment: No/denies pain    Home Living Family/patient expects to be discharged to:: Private residence Living Arrangements: Non-relatives/Friends Available Help at Discharge: Available 24 hours/day;Family Type of Home: House Home Access: Stairs to enter Entrance Stairs-Rails: Right Entrance Stairs-Number of Steps: 2   Home Layout: One level Home Equipment: Grab bars - tub/shower Additional Comments: works full time Engineer, 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    Prior Function Prior Level of Function : Independent/Modified Independent;Working/employed;Driving;History of Falls (last six months)  Mobility Comments: Ind no AD ADLs Comments: ind     Extremity/Trunk Assessment   Upper Extremity Assessment Upper Extremity Assessment: Overall WFL for tasks assessed    Lower  Extremity Assessment Lower Extremity Assessment: Overall WFL for tasks assessed    Cervical / Trunk Assessment Cervical / Trunk Assessment: Normal  Communication   Communication Communication: No apparent difficulties    Cognition Arousal: Alert Behavior During Therapy: WFL for tasks assessed/performed   PT - Cognitive impairments: No apparent impairments                       PT - Cognition Comments: A&Ox4 Following commands: Intact       Cueing Cueing Techniques: Verbal cues, Gestural cues     General Comments General comments (skin integrity, edema, etc.): Pt c/o dizzness when initially standing, VSS on RA. Dizziness improved upon second standing attempt    Exercises     Assessment/Plan    PT Assessment Patient needs continued PT services  PT Problem List Decreased activity tolerance;Decreased balance;Decreased mobility;Decreased coordination       PT Treatment Interventions Gait training;Stair training;Functional mobility training;Therapeutic activities;Balance training;Patient/family education    PT Goals (Current goals can be found in the Care Plan section)  Acute Rehab PT Goals Patient Stated Goal: Return to PLOF and home PT Goal Formulation: With patient Time For Goal Achievement: 05/20/23 Potential to Achieve Goals: Good    Frequency Min 2X/week     Co-evaluation               AM-PAC PT "6 Clicks" Mobility  Outcome Measure Help needed turning from your back to your side while in a flat bed without using bedrails?: None Help needed moving from lying on your back to sitting on the side of a flat bed without using bedrails?: None Help needed moving to and from a bed to a chair (including a wheelchair)?: A Little Help needed standing up from a chair using your arms (e.g., wheelchair or bedside chair)?: A Little Help needed to walk in hospital room?: A Little Help needed climbing 3-5 steps with a railing? : A Little 6 Click Score: 20     End of Session Equipment Utilized During Treatment: Gait belt Activity Tolerance: Patient tolerated treatment well Patient left: in chair;with call bell/phone within reach;with family/visitor present   PT Visit Diagnosis: Unsteadiness on feet (R26.81);History of falling (Z91.81)    Time: 7829-5621 PT Time Calculation (min) (ACUTE ONLY): 31 min   Charges:   PT Evaluation $PT Eval Low Complexity: 1 Low PT Treatments $Gait Training: 8-22 mins PT General Charges $$ ACUTE PT VISIT: 1 Visit        Doreen Beam, SPT   Jeffrey Stevens 05/06/2023, 12:15 PM

## 2023-05-06 NOTE — Progress Notes (Signed)
 [facility] PROGRESS NOTE   Jeffrey Stevens LKG:[id number] DOB: 1950/09/06 DOA: 05/04/2023  PCP: Renaye Rakers, MD  Brief History: 73 y.o. male with a past medical history of insulin-dependent diabetes, essential hypertension, hyperlipidemia who was in his usual state of health until the evening prior to admission when he had sudden confusion and memory loss according to his daughters.  Concern was for TIA.  He was hospitalized for further management  Consultants: Neurology  Procedures: Echocardiogram is pending    Subjective/Interval History: Patient mentions that he is feeling well.  Denies any headache shortness of breath nausea vomiting.  His daughter is at the bedside.  He apparently had a fall in the bathroom last night but does not report any injuries.  Daughter mentions that he has had some memory issues for a while now.  He is having balance issues for a while as well.    Assessment/Plan:  Transient neurological event Stroke has been ruled out on MRI.  Old infarct was noted.   Patient underwent CT angiogram head and neck as well.  Could have had a TIA.  No infectious etiology has been found.  Urine drug screen was unremarkable.  EEG was also unremarkable. Patient's daughter today mentions that he has had imbalance issues as well as memory impairment for some time now.  Patient could have cognitive impairment. Await neurology input. Wait on echocardiogram. No focal neurological deficits noted this morning.  LDL is 78.  Vitamin B12 level is 464.  TSH is 2.14.  RPR and HIV nonreactive.  HbA1c 8.1. PT and OT evaluation is pending. Patient's daughter reports imbalance ongoing for a while.  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  He had a fall yesterday.   History suggestive of ataxia.  Complains of back pain. Will proceed with MRIs cervical thoracic and lumbar spine.  Essential hypertension Antihypertensives currently on  hold.  Since no stroke was identified on MRI antihypertensives could be resumed.  Blood pressure noted to be normal this morning.  Continue to monitor for now.  Insulin-dependent diabetes mellitus type 2 HbA1c 8.1.  Monitor CBGs.  SSI.  He is on long-acting insulin at home which is being continued at a lower dose.  Will need dose adjustment since his glucose levels are elevated.  Normocytic anemia/iron deficiency Stable hemoglobin.   Anemia panel reviewed.  Ferritin is 11, iron is 58, TIBC 329, percent saturation 18.  Folic acid level 15.6.  Vitamin B12 564.   Could have an element of iron deficiency.  Can be further evaluated in the outpatient setting.  Mild abdominal discomfort on palpation at admission Abdomen is nontender this morning.  Hyperlipidemia LDL is 78.  Continue statin.   DVT Prophylaxis: Lovenox Code Status: Full code Family Communication: Discussed with daughter Disposition Plan: Hopefully discharge home when workup has been completed  Status is: Observation The patient remains OBS appropriate and may or may not d/c before 2 midnights.      Medications: Scheduled:  aspirin  81 mg Oral Daily   clopidogrel  75 mg Oral Daily   enoxaparin (LOVENOX) injection  30 mg Subcutaneous Q24H   feeding supplement  237 mL Oral BID BM   fluticasone furoate-vilanterol  1 puff Inhalation Daily   insulin aspart  0-5 Units Subcutaneous QHS   insulin aspart  0-9 Units Subcutaneous TID WC   insulin glargine  10 Units Subcutaneous QHS   [START ON 05/07/2023] rosuvastatin  20 mg  Oral Daily   sodium chloride flush  3-10 mL Intravenous Q12H   umeclidinium bromide  1 puff Inhalation Daily   Continuous: ONG:[id number] **OR** acetaminophen (TYLENOL) oral liquid 160 mg/5 mL **OR** acetaminophen, senna-docusate, sodium chloride flush  Antibiotics: Anti-infectives (From admission, onward)    None       Objective:  Vital Signs  Vitals:   05/05/23 2000 05/05/23 2331 05/06/23  0441 05/06/23 0758  BP: (!) 152/69 133/88 118/64 135/83  Pulse: 64 70 (!) 57 73  Resp: 18 18 18 17   Temp: 98.2 F (36.8 C) 98.6 F (37 C) 98.2 F (36.8 C) 98 F (36.7 C)  TempSrc: Oral Oral Oral Oral  SpO2: 99% 99% 99% 100%  Weight:      Height:        Intake/Output Summary (Last 24 hours) at 05/06/2023 1101 Last data filed at 05/06/2023 0900 Gross per 24 hour  Intake 490 ml  Output --  Net 490 ml   Filed Weights   05/04/23 2046  Weight: 54.2 kg    General appearance: Awake alert.  In no distress.  Mildly distracted Resp: Clear to auscultation bilaterally.  Normal effort Cardio: S1-S2 is normal regular.  No S3-S4.  No rubs murmurs or bruit GI: Abdomen is soft.  Nontender nondistended.  Bowel sounds are present normal.  No masses organomegaly Extremities: No edema.  Full range of motion of lower extremities. Neurologic: Alert and oriented x3.  No focal neurological deficits.    Lab Results:  Data Reviewed: I have personally reviewed following labs and reports of the imaging studies  CBC: Recent Labs  Lab 05/04/23 2108 05/04/23 2110 05/06/23 0550  WBC 6.3  --  4.8  NEUTROABS  --  4.4  --   HGB 10.9*  --  12.2*  HCT 32.5*  --  35.6*  MCV 94.2  --  92.0  PLT 166  --  168    Basic Metabolic Panel: Recent Labs  Lab 05/04/23 2108 05/04/23 2217 05/06/23 0550  NA 141 141 138  K 3.6 3.5 3.6  CL 108 109 106  CO2 22 25 23   GLUCOSE 156* 164* 253*  BUN 22 22 11   CREATININE 1.06 0.96 0.87  CALCIUM 9.4 9.0 8.9  MG  --   --  1.9    GFR: Estimated Creatinine Clearance: 57.1 mL/min (by C-G formula based on SCr of 0.87 mg/dL).  Liver Function Tests: Recent Labs  Lab 05/04/23 2108 05/04/23 2217  AST 15 14*  ALT 14 13  ALKPHOS 37* 36*  BILITOT 0.8 0.6  PROT 6.9 6.4*  ALBUMIN 3.6 3.4*     Coagulation Profile: Recent Labs  Lab 05/04/23 2217  INR 1.1    HbA1C: Recent Labs    05/06/23 0550  HGBA1C 8.1*    CBG: Recent Labs  Lab 05/05/23 1202  05/05/23 1718 05/05/23 2049 05/06/23 0521 05/06/23 0732  GLUCAP 259* 107* 369* 239* 219*    Lipid Profile: Recent Labs    05/06/23 0550  CHOL 158  HDL 70  LDLCALC 78  TRIG 50  CHOLHDL 2.3    Thyroid Function Tests: Recent Labs    05/06/23 0550  TSH 2.142    Anemia Panel: Recent Labs    05/06/23 0550  VITAMINB12 464  FOLATE 15.6  FERRITIN 11*  TIBC 329  IRON 58  RETICCTPCT 0.9    Radiology Studies: EEG adult Result Date: 05/05/2023 Jeffrey Quest, MD     05/05/2023 11:03 PM Patient Name: Jeffrey Cox  Stevens MRN: [id number] Epilepsy Attending: Charlsie Stevens Referring Physician/Provider: Osvaldo Shipper, MD Date: 05/05/2023 Duration: 27.17 mins Patient history: 73yo M with transient confusion. EEG to evaluate for seizure Level of alertness: Awake AEDs during EEG study: None Technical aspects: This EEG study was done with scalp electrodes positioned according to the 10-20 International system of electrode placement. Electrical activity was reviewed with band pass filter of 1-70Hz , sensitivity of 7 uV/mm, display speed of 69mm/sec with a 60Hz  notched filter applied as appropriate. EEG data were recorded continuously and digitally stored.  Video monitoring was available and reviewed as appropriate. Description: The posterior dominant rhythm consists of 8 Hz activity of moderate voltage (25-35 uV) seen predominantly in posterior head regions, symmetric and reactive to eye opening and eye closing. Physiologic photic driving was not seen during photic stimulation.  Hyperventilation was not performed.   IMPRESSION: This study is within normal limits. No seizures or epileptiform discharges were seen throughout the recording. A normal interictal EEG does not exclude the diagnosis of epilepsy. Jeffrey Stevens   MR BRAIN WO CONTRAST Result Date: 05/05/2023 CLINICAL DATA:  Altered mental status EXAM: MRI HEAD WITHOUT CONTRAST TECHNIQUE: Multiplanar, multiecho pulse sequences of the brain  and surrounding structures were obtained without intravenous contrast. COMPARISON:  None Available. FINDINGS: Brain: No acute infarct, mass effect or extra-axial collection. No acute or chronic hemorrhage. There is multifocal hyperintense T2-weighted signal within the white matter. Generalized volume loss. Old left cerebellar infarct. The midline structures are normal. Vascular: Normal flow voids. Skull and upper cervical spine: Normal calvarium and skull base. Visualized upper cervical spine and soft tissues are normal. Sinuses/Orbits:No paranasal sinus fluid levels or advanced mucosal thickening. No mastoid or middle ear effusion. Normal orbits. IMPRESSION: 1. No acute intracranial abnormality. 2. 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 Electronically Signed   By: Deatra Robinson M.D.   On: 05/05/2023 20:53   CT ANGIO HEAD NECK W WO CM (CODE STROKE) Result Date: 05/04/2023 CLINICAL DATA:  Acute neurologic deficit EXAM: CT ANGIOGRAPHY HEAD AND NECK WITH AND WITHOUT CONTRAST TECHNIQUE: Multidetector CT imaging of the head and neck was performed using the standard protocol during bolus administration of intravenous contrast. Multiplanar CT image reconstructions and MIPs were obtained to evaluate the vascular anatomy. Carotid stenosis measurements (when applicable) are obtained utilizing NASCET criteria, using the distal internal carotid diameter as the denominator. RADIATION DOSE REDUCTION: This exam was performed according to the departmental dose-optimization program which includes automated exposure control, adjustment of the mA and/or kV according to patient size and/or use of iterative reconstruction technique. CONTRAST:  75mL OMNIPAQUE IOHEXOL 350 MG/ML SOLN COMPARISON:  None Available. FINDINGS: CTA NECK FINDINGS Skeleton: No acute abnormality or high grade bony spinal canal stenosis. Other neck: Normal pharynx, larynx and major salivary glands. No cervical lymphadenopathy.  Unremarkable thyroid gland. Upper chest: Biapical emphysema. Aortic arch: There is no calcific atherosclerosis of the aortic arch. Conventional 3 vessel aortic branching pattern. RIGHT carotid system: Normal without aneurysm, dissection or stenosis. LEFT carotid system: Normal without aneurysm, dissection or stenosis. Vertebral arteries: Codominant configuration. There is no dissection, occlusion or flow-limiting stenosis to the skull base (V1-V3 segments). CTA HEAD FINDINGS POSTERIOR CIRCULATION: Vertebral arteries are normal. No proximal occlusion of the anterior or inferior cerebellar arteries. Basilar artery is normal. Superior cerebellar arteries are normal. Posterior cerebral arteries are normal. ANTERIOR CIRCULATION: Atherosclerotic calcification of the internal carotid arteries at the skull base without hemodynamically significant stenosis. Anterior cerebral arteries are  normal. Middle cerebral arteries are normal. Venous sinuses: As permitted by contrast timing, patent. Anatomic variants: None Review of the MIP images confirms the above findings. IMPRESSION: 1. No emergent large vessel occlusion or hemodynamically significant stenosis of the head or neck. Electronically Signed   By: Deatra Robinson M.D.   On: 05/04/2023 22:17   CT HEAD CODE STROKE WO CONTRAST Result Date: 05/04/2023 CLINICAL DATA:  Code stroke.  Acute neurologic deficit EXAM: CT HEAD WITHOUT CONTRAST TECHNIQUE: Contiguous axial images were obtained from the base of the skull through the vertex without intravenous contrast. RADIATION DOSE REDUCTION: This exam was performed according to the departmental dose-optimization program which includes automated exposure control, adjustment of the mA and/or kV according to patient size and/or use of iterative reconstruction technique. COMPARISON:  None Available. FINDINGS: Brain: There is no mass, hemorrhage or extra-axial collection. The size and configuration of the ventricles and extra-axial CSF  spaces are normal. There is hypoattenuation of the periventricular white matter, most commonly indicating chronic ischemic microangiopathy. Old left cerebellar infarct. Vascular: No abnormal hyperdensity of the major intracranial arteries or dural venous sinuses. No intracranial atherosclerosis. Skull: The visualized skull base, calvarium and extracranial soft tissues are normal. Sinuses/Orbits: No fluid levels or advanced mucosal thickening of the visualized paranasal sinuses. No mastoid or middle ear effusion. The orbits are normal. ASPECTS (Alberta Stroke Program Early CT Score) - Ganglionic level infarction (caudate, lentiform nuclei, internal capsule, insula, M1-M3 cortex): 7 - Supraganglionic infarction (M4-M6 cortex): 3 Total score (0-10 with 10 being normal): 10 Extended scout image shows no metallic object in the head, neck, chest, abdomen or pelvis that would be a contraindication to MRI. IMPRESSION: 1. No acute intracranial abnormality. 2. ASPECTS is 10. 3. 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 These results were called by telephone at the time of interpretation on 05/04/2023 at 9:47 pm to provider Vanetta Mulders , who verbally acknowledged these results. Electronically Signed   By: Deatra Robinson M.D.   On: 05/04/2023 21:49       LOS: 0 days   Roran Wegner Rito Ehrlich  [facility] Pager on [email or web address]  05/06/2023, 11:01 AM

## 2023-05-06 NOTE — Plan of Care (Signed)

## 2023-05-08 ENCOUNTER — Encounter: Payer: Self-pay | Admitting: Internal Medicine

## 2023-05-09 ENCOUNTER — Encounter: Payer: Self-pay | Admitting: Internal Medicine

## 2023-05-10 ENCOUNTER — Other Ambulatory Visit: Payer: Self-pay

## 2023-05-10 ENCOUNTER — Encounter: Payer: Self-pay | Admitting: Internal Medicine

## 2023-05-10 ENCOUNTER — Ambulatory Visit: Attending: Internal Medicine | Admitting: Physical Therapy

## 2023-05-10 DIAGNOSIS — R2689 Other abnormalities of gait and mobility: Secondary | ICD-10-CM | POA: Insufficient documentation

## 2023-05-10 DIAGNOSIS — R270 Ataxia, unspecified: Secondary | ICD-10-CM | POA: Diagnosis not present

## 2023-05-10 NOTE — Therapy (Signed)
 OUTPATIENT PHYSICAL THERAPY NEURO EVALUATION   Patient Name: Jeffrey Stevens MRN: [id number] DOB:02-Dec-1950, 73 y.o., male Today's Date: 05/10/2023  PCP: Renaye Rakers, MD REFERRING PROVIDER: Osvaldo Shipper, MD  END OF SESSION:  PT End of Session - 05/10/23 1358     Visit Number 1    Number of Visits 3    Date for PT Re-Evaluation 06/07/23    Authorization Type Medicare    PT Start Time 1400    PT Stop Time 1440    PT Time Calculation (min) 40 min    Activity Tolerance Patient tolerated treatment well             Past Medical History:  Diagnosis Date   Diabetes mellitus without complication (HCC)    Dyslipidemia    Hypertension    Pneumonia 05/23/2014   Past Surgical History:  Procedure Laterality Date   COLONOSCOPY  2015   Patient Active Problem List   Diagnosis Date Noted   AMS (altered mental status) 05/04/2023   CAP (community acquired pneumonia) 05/24/2014   Sepsis due to pneumonia (HCC) 05/23/2014   Influenza A with pneumonia 05/23/2014   COPD exacerbation (HCC) 05/23/2014   Diabetes type 2, uncontrolled 05/23/2014   HLD (hyperlipidemia) 05/23/2014   Tobacco abuse 05/23/2014   Essential hypertension    Deficiency anemia 06/05/2013    ONSET DATE: 05/04/23 - 05/07/23 in the hospital  REFERRING DIAG:  R27.0 (ICD-10-CM) - Ataxia   THERAPY DIAG:  Other abnormalities of gait and mobility  Rationale for Evaluation and Treatment: Rehabilitation  SUBJECTIVE:                                                                                                                                                                                             SUBJECTIVE STATEMENT: Friend provides most of the history. Pt's friend states that pt is doing a lot better after leaving the hospital. Pt went to the ED due to increased gait dysfunction and confusion on 05/04/23. Pt does have a roommate and people at home who can check on pt. "I feel like I'm alright." Pt is a  Museum/gallery exhibitions officer. Has been driving well. Has been cleared back to work 05/10/23 but boss wanted him checked to make sure he was safe.  Pt accompanied by: friend, Darl Pikes  PERTINENT HISTORY: "73 y.o. male with a past medical history of insulin-dependent diabetes, essential hypertension, hyperlipidemia who was in his usual state of health until the evening prior to admission when he had sudden confusion and memory loss according to his daughters"  PAIN:  Are you having pain? No  PRECAUTIONS: None  RED  FLAGS: None   WEIGHT BEARING RESTRICTIONS: No  FALLS: Has patient fallen in last 6 months? Yes. 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  DIAGNOSTIC FINDINGS: IMPRESSION: 1. No acute intracranial abnormality. 2. 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  COGNITION: Overall cognitive status: Within functional limits for tasks assessed   SENSATION: WFL  COORDINATION: Heel to shin WNL Finger opposition WNL  EDEMA:  None  MUSCLE TONE: None  MUSCLE LENGTH: Did not assess   POSTURE: decreased thoracic kyphosis  LLOWER EXTREMITY MMT:     MMT Right Eval Left Eval  Hip flexion 4+ 4+  Hip extension    Hip abduction    Hip adduction    Hip internal rotation    Hip external rotation    Knee flexion 5 5  Knee extension 5 5  Ankle dorsiflexion 5 5  Ankle plantarflexion    Ankle inversion    Ankle eversion     (Blank rows = not tested)  UPPER EXTREMITY MMT:  MMT Right eval Left eval  Shoulder flexion 4 4+  Shoulder extension 4+ 4+  Shoulder abduction 4 4+  Shoulder  adduction    Shoulder extension    Shoulder internal rotation 5 5  Shoulder external rotation 5 5  Middle trapezius    Lower trapezius    Elbow flexion    Elbow extension    Wrist flexion    Wrist extension    Wrist ulnar deviation    Wrist radial deviation    Wrist pronation    Wrist supination    Grip strength     (Blank rows = not tested)   BED MOBILITY:  Independent  TRANSFERS: Assistive device utilized: None  Sit to stand: Complete Independence Stand to sit: Complete Independence Chair to chair: Complete Independence Floor:  Did not assess   STAIRS: Level of Assistance: SBA Stair Negotiation Technique: Alternating Pattern  with No Rails Number of Stairs: 5  Height of Stairs: 6"  Comments: WNL  GAIT: Gait pattern: step through pattern, decreased step length- Right, and decreased step length- Left Distance walked: Throughout clinic Assistive device utilized: None Level of assistance: SBA due to PT checking balance today Comments: WNL just very slowed gait speed (0.7 m/s)  FUNCTIONAL TESTS:  5 times sit to stand: 18.38 sec  (1 minor LOB) Berg Balance Score: 49/54 FGA: 24/30  [facility] PT Assessment - 05/10/23 0001       Balance   Balance Assessed Yes      Standardized Balance Assessment   Standardized Balance Assessment Berg Balance Test      Berg Balance Test   Sit to Stand Able to stand without using hands and stabilize independently    Standing Unsupported Able to stand safely 2 minutes    Sitting with Back Unsupported but Feet Supported on Floor or Stool Able to sit safely and securely 2 minutes    Stand to Sit Sits safely with minimal use of hands    Transfers Able to transfer safely, minor use of hands    Standing Unsupported with Eyes Closed Able to stand 10 seconds safely  Standing Unsupported with Feet Together Able to place feet together independently and stand 1 minute safely    From Standing, Reach Forward with Outstretched Arm Can reach  confidently >25 cm (10")    From Standing Position, Pick up Object from Floor Able to pick up shoe safely and easily    From Standing Position, Turn to Look Behind Over each Shoulder Looks behind one side only/other side shows less weight shift   Increased rotation to L   Turn 360 Degrees Able to turn 360 degrees safely but slowly   5 sec   Standing Unsupported, Alternately Place Feet on Step/Stool Able to stand independently and safely and complete 8 steps in 20 seconds    Standing Unsupported, One Foot in Front Able to plae foot ahead of the other independently and hold 30 seconds    Standing on One Leg Tries to lift leg/unable to hold 3 seconds but remains standing independently   6.15 sec on L; 2 sec on R   Total Score 49      Functional Gait  Assessment   Gait assessed  Yes    Gait Level Surface Walks 20 ft, slow speed, abnormal gait pattern, evidence for imbalance or deviates 10-15 in outside of the 12 in walkway width. Requires more than 7 sec to ambulate 20 ft.   8.58 sec   Change in Gait Speed Able to change speed, demonstrates mild gait deviations, deviates 6-10 in outside of the 12 in walkway width, or no gait deviations, unable to achieve a major change in velocity, or uses a change in velocity, or uses an assistive device.    Gait with Horizontal Head Turns Performs head turns smoothly with no change in gait. Deviates no more than 6 in outside 12 in walkway width    Gait with Vertical Head Turns Performs head turns with no change in gait. Deviates no more than 6 in outside 12 in walkway width.    Gait and Pivot Turn Pivot turns safely within 3 sec and stops quickly with no loss of balance.    Step Over Obstacle Is able to step over 2 stacked shoe boxes taped together (9 in total height) without changing gait speed. No evidence of imbalance.    Gait with Narrow Base of Support Ambulates 7-9 steps.    Gait with Eyes Closed Walks 20 ft, uses assistive device, slower speed, mild gait  deviations, deviates 6-10 in outside 12 in walkway width. Ambulates 20 ft in less than 9 sec but greater than 7 sec.    Ambulating Backwards Walks 20 ft, uses assistive device, slower speed, mild gait deviations, deviates 6-10 in outside 12 in walkway width.    Steps Alternating feet, no rail.    Total Score 24             PATIENT SURVEYS:  The Patient-Specific Functional Scale  Initial:  I am going to ask you to identify up to 3 important activities that you are unable to do or are having difficulty with as a result of this problem.  Today are there any activities that you are unable to do or having difficulty with because of this?  (Patient shown scale and patient rated each activity)  Follow up: When you first came in you had difficulty performing these activities.  Today do you still have difficulty?  Patient-Specific activity scoring scheme (Point to one number):  0 1 2 3 4 5 6 7 8 9  10 Unable  Able to perform To perform                                                                                                    activity at the same Activity         Level as before                                                                                                                       Injury or problem Activity Baseline: During/right before hospitalization: PT Eval:  Walking 7 "I've never been a 10" 4 7  2.         3.                                                                                                                                       TREATMENT DATE: 05/10/23 See HEP below    PATIENT EDUCATION: Education details: Exam findings/balance score interpretations, POC, initial HEP Person educated: Patient and friend Education method: Explanation, Demonstration, and Handouts Education comprehension: verbalized understanding, returned demonstration, and needs  further education  HOME EXERCISE PROGRAM: Access Code: [email or web address] URL: [email or web address] Date: 05/10/2023 Prepared by: Vernon Prey April Kirstie Peri  Program Notes Walk for a total of 10 minutes at least 2-3 times a day. Work on alternating normal speed walking with fast walking for 2 minute bouts.   Exercises (with red TB) - Backwards Walking  - 1 x daily - 7 x weekly - 3 sets - 10 reps - Tandem Walking with Counter Support  - 1 x daily - 7 x weekly - 2 sets - 10 reps - Side Stepping with Resistance at Ankles  - 1 x daily - 7 x weekly - 3 sets - 10 reps - Marching with Resistance  - 1 x daily - 7 x weekly - 3 sets - 10 reps  GOALS: Goals reviewed with patient? Yes  SHORT/LONG TERM GOALS: Target date: 06/07/2023   Pt will be ind with initial HEP  Baseline: Goal status: INITIAL  2.  Pt will have improved FGA to >/=26/30 to demo low fall risk Baseline:  Goal status: INITIAL  3.  Pt will have improved 5x STS to </=13 sec Baseline:  Goal status: INITIAL    ASSESSMENT:  CLINICAL IMPRESSION: Patient is a 73 y.o. M who was seen today for physical therapy evaluation and treatment for gait instability s/p episode of confusion and decreased balance on 05/04/23 requiring hospitalization. Pt d/ced from hospital on 05/07/23 back to home. Per acute PT notes, pt did have a fall in the bathroom while admitted and had posterior lean. On assessment today, pt demos moderate fall risk on FGA and 5x STS but is a low fall risk based on his Berg. Per pt's report of his own functional status, he feels he is pretty much at his normal per his PSFS. Provided pt with HEP today and discussed with pt returning to more normal activity and then reassessing 2 weeks from now to ensure he is a low fall risk. Pt is close to his baseline status at this time and will benefit from return to his normal schedule and work status (primarily drives fork lift).  OBJECTIVE IMPAIRMENTS: Abnormal gait, decreased  balance, decreased endurance, difficulty walking, and improper body mechanics.   ACTIVITY LIMITATIONS: locomotion level  PARTICIPATION LIMITATIONS: community activity and occupation  PERSONAL FACTORS: Age, Fitness, Past/current experiences, and Time since onset of injury/illness/exacerbation are also affecting patient's functional outcome.   REHAB POTENTIAL: Good  CLINICAL DECISION MAKING: Stable/uncomplicated  EVALUATION COMPLEXITY: Low  PLAN:  PT FREQUENCY:  1-2 more visit follow up in 2-4 weeks  PT DURATION: 4 weeks  PLANNED INTERVENTIONS: 97164- PT Re-evaluation, 97110-Therapeutic exercises, 97530- Therapeutic activity, 97112- Neuromuscular re-education, 97535- Self Care, 16109- Manual therapy, [phone]- Gait training, Patient/Family education, Balance training, Stair training, and Taping  PLAN FOR NEXT SESSION: Recheck Berg, FGA, and 5x STS to insure pt is a low fall risk. Modify/Update HEP as indicated and likely d/c.   Aeson Sawyers April Ma L Peniel Hass, PT 05/10/2023, 4:01 PM

## 2023-05-24 ENCOUNTER — Ambulatory Visit: Attending: Internal Medicine | Admitting: Physical Therapy

## 2023-05-24 ENCOUNTER — Encounter: Payer: Self-pay | Admitting: Physical Therapy

## 2023-05-24 VITALS — BP 136/64 | HR 88

## 2023-05-24 DIAGNOSIS — R2689 Other abnormalities of gait and mobility: Secondary | ICD-10-CM | POA: Insufficient documentation

## 2023-05-24 NOTE — Therapy (Unsigned)
 OUTPATIENT PHYSICAL THERAPY NEURO TREATMENT   Patient Name: Jeffrey Stevens MRN: [id number] DOB:[birth year], 73 y.o., male Today's Date: 05/24/2023  PCP: Renaye Rakers, MD REFERRING PROVIDER: Osvaldo Shipper, MD  END OF SESSION:  PT End of Session - 05/24/23 1623     Visit Number 2    Number of Visits 3    Date for PT Re-Evaluation 06/07/23    Authorization Type Medicare    PT Start Time 1420    Equipment Utilized During Treatment Gait belt    Activity Tolerance Patient tolerated treatment well    Behavior During Therapy [facility] for tasks assessed/performed             Past Medical History:  Diagnosis Date   Diabetes mellitus without complication (HCC)    Dyslipidemia    Hypertension    Pneumonia 05/23/2014   Past Surgical History:  Procedure Laterality Date   COLONOSCOPY  2015   Patient Active Problem List   Diagnosis Date Noted   AMS (altered mental status) 05/04/2023   CAP (community acquired pneumonia) 05/24/2014   Sepsis due to pneumonia (HCC) 05/23/2014   Influenza A with pneumonia 05/23/2014   COPD exacerbation (HCC) 05/23/2014   Diabetes type 2, uncontrolled 05/23/2014   HLD (hyperlipidemia) 05/23/2014   Tobacco abuse 05/23/2014   Essential hypertension    Deficiency anemia 06/05/2013    ONSET DATE: 05/04/23 - 05/07/23 in the hospital  REFERRING DIAG:  R27.0 (ICD-10-CM) - Ataxia   THERAPY DIAG:  Other abnormalities of gait and mobility  Rationale for Evaluation and Treatment: Rehabilitation  SUBJECTIVE:                                                                                                                                                                                             SUBJECTIVE STATEMENT: Patient reports that he is doing well. Reports walking is near his baseline and reports that he is back at work. Denies falls and near falls since last here for PT. Reports no questions about prior HEP.   Pt accompanied by: friend,  Darl Pikes  PERTINENT HISTORY: "73 y.o. male with a past medical history of insulin-dependent diabetes, essential hypertension, hyperlipidemia who was in his usual state of health until the evening prior to admission when he had sudden confusion and memory loss according to his daughters"  PAIN:  Are you having pain? No  PRECAUTIONS: None  RED FLAGS: None   WEIGHT BEARING RESTRICTIONS: No  FALLS: Has patient fallen in last 6 months? Yes. 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  DIAGNOSTIC FINDINGS: IMPRESSION: 1. No acute intracranial abnormality. 2. 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  COGNITION: Overall cognitive status: Within functional limits for tasks assessed   SENSATION: WFL  COORDINATION: Heel to shin WNL Finger opposition WNL  EDEMA:  None  MUSCLE TONE: None  MUSCLE LENGTH: Did not assess   POSTURE: decreased thoracic kyphosis  LLOWER EXTREMITY MMT:     MMT Right Eval Left Eval  Hip flexion 4+ 4+  Hip extension    Hip abduction    Hip adduction    Hip internal rotation    Hip external rotation    Knee flexion 5 5  Knee extension 5 5  Ankle dorsiflexion 5 5  Ankle plantarflexion    Ankle inversion    Ankle eversion     (Blank rows = not tested)  UPPER EXTREMITY MMT:  MMT Right eval Left eval  Shoulder flexion 4 4+  Shoulder extension 4+ 4+  Shoulder abduction 4 4+  Shoulder adduction    Shoulder extension    Shoulder internal rotation 5 5  Shoulder external rotation 5 5  Middle trapezius    Lower trapezius    Elbow flexion     Elbow extension    Wrist flexion    Wrist extension    Wrist ulnar deviation    Wrist radial deviation    Wrist pronation    Wrist supination    Grip strength     (Blank rows = not tested)   BED MOBILITY:  Independent  TRANSFERS: Assistive device utilized: None  Sit to stand: Complete Independence Stand to sit: Complete Independence Chair to chair: Complete Independence Floor:  Did not assess   STAIRS: Level of Assistance: SBA Stair Negotiation Technique: Alternating Pattern  with No Rails Number of Stairs: 5  Height of Stairs: 6"  Comments: WNL  GAIT: Gait pattern: step through pattern, decreased step length- Right, and decreased step length- Left Distance walked: Throughout clinic Assistive device utilized: None Level of assistance: SBA due to PT checking balance today Comments: WNL just very slowed gait speed (0.7 m/s)  FUNCTIONAL TESTS:  5 times sit to stand: 18.38 sec  (1 minor LOB) Berg Balance Score: 49/54 FGA: 24/30    PATIENT SURVEYS:  The Patient-Specific Functional Scale  Initial:  I am going to ask you to identify up to 3 important activities that you are unable to do or are having difficulty with as a result of this problem.  Today are there any activities that you are unable to do or having difficulty with because of this?  (Patient shown scale and patient rated each activity)  Follow up: When you first came in you had difficulty performing these activities.  Today do you still have difficulty?  Patient-Specific activity scoring scheme (Point to one number):  0 1 2 3 4 5 6 7 8 9  10 Unable  Able to perform To perform                                                                                                    activity at the same Activity         Level as before                                                                                                                        Injury or problem Activity Baseline: During/right before hospitalization: PT Eval:  Walking 7 "I've never been a 10" 4 7  2.         3.                                                                                                                                       TREATMENT DATE: 05/10/23 See HEP below    PATIENT EDUCATION: Education details: Exam findings/balance score interpretations, POC, initial HEP Person educated: Patient and friend Education method: Explanation, Demonstration, and Handouts Education comprehension: verbalized understanding, returned demonstration, and needs further education  HOME EXERCISE PROGRAM: Access Code: [email or web address] URL: [email or web address] Date: 05/10/2023 Prepared by: Vernon Prey April Kirstie Peri  Program Notes Walk for a total of 10 minutes at least 2-3 times a day. Work on alternating normal speed walking with fast walking for 2 minute bouts.   Exercises (with red TB) - Backwards Walking  - 1 x daily - 7 x weekly - 3 sets - 10 reps - Tandem Walking with Counter Support  - 1 x daily - 7 x weekly - 2 sets - 10 reps - Side Stepping with Resistance at Ankles  - 1 x daily - 7 x weekly - 3 sets - 10 reps - Marching with Resistance  - 1 x daily - 7 x weekly - 3 sets - 10 reps  GOALS: Goals reviewed with patient? Yes  SHORT/LONG TERM GOALS: Target date: 06/07/2023   Pt will be ind with initial HEP  Baseline: Goal status: INITIAL  2.  Pt will have improved FGA to >/=26/30 to demo low fall risk Baseline:  Goal status: INITIAL  3.  Pt will have improved 5x STS to </=13 sec Baseline:  Goal status: INITIAL    ASSESSMENT:  CLINICAL IMPRESSION: Patient is a 73 y.o. M who was seen today for physical therapy evaluation and treatment for gait instability s/p episode of confusion and decreased balance on 05/04/23 requiring hospitalization. Pt d/ced from hospital on 05/07/23 back to home. Per acute  PT notes, pt did have a fall in the bathroom while admitted and had posterior lean. On assessment today, pt demos moderate fall risk on FGA and 5x STS but is a low fall risk based on his Berg. Per pt's report of his own functional status, he feels he is pretty much at his normal per his PSFS. Provided pt with HEP today and discussed with pt returning to more normal activity and then reassessing 2 weeks from now to ensure he is a low fall risk. Pt is close to his baseline status at this time and will benefit from return to his normal schedule and work status (primarily drives fork lift).  OBJECTIVE IMPAIRMENTS: Abnormal gait, decreased balance, decreased endurance, difficulty walking, and improper body mechanics.   ACTIVITY LIMITATIONS: locomotion level  PARTICIPATION LIMITATIONS: community activity and occupation  PERSONAL FACTORS: Age, Fitness, Past/current experiences, and Time since onset of injury/illness/exacerbation are also affecting patient's functional outcome.   REHAB POTENTIAL: Good  CLINICAL DECISION MAKING: Stable/uncomplicated  EVALUATION COMPLEXITY: Low  PLAN:  PT FREQUENCY:  1-2 more visit follow up in 2-4 weeks  PT DURATION: 4 weeks  PLANNED INTERVENTIONS: 97164- PT Re-evaluation, 97110-Therapeutic exercises, 97530- Therapeutic activity, 97112- Neuromuscular re-education, 97535- Self Care, 40981- Manual therapy, [phone]- Gait training, Patient/Family education, Balance training, Stair training, and Taping  PLAN FOR NEXT SESSION: Recheck Berg, FGA, and 5x STS to insure pt is a low fall risk. Modify/Update HEP as indicated and likely d/c.   Carmelia Bake, PT 05/24/2023, 4:24 PM

## 2023-05-25 ENCOUNTER — Encounter: Payer: Self-pay | Admitting: Physical Therapy

## 2023-07-05 ENCOUNTER — Ambulatory Visit (INDEPENDENT_AMBULATORY_CARE_PROVIDER_SITE_OTHER): Admitting: Neurology

## 2023-07-05 ENCOUNTER — Encounter: Payer: Self-pay | Admitting: Neurology

## 2023-07-05 VITALS — BP 129/79 | HR 89 | Wt 121.0 lb

## 2023-07-05 DIAGNOSIS — Z820 Family history of epilepsy and other diseases of the nervous system: Secondary | ICD-10-CM

## 2023-07-05 DIAGNOSIS — G309 Alzheimer's disease, unspecified: Secondary | ICD-10-CM | POA: Diagnosis not present

## 2023-07-05 DIAGNOSIS — R410 Disorientation, unspecified: Secondary | ICD-10-CM

## 2023-07-05 DIAGNOSIS — I639 Cerebral infarction, unspecified: Secondary | ICD-10-CM

## 2023-07-05 DIAGNOSIS — G3184 Mild cognitive impairment, so stated: Secondary | ICD-10-CM

## 2023-07-05 MED ORDER — MEMANTINE HCL 28 X 5 MG & 21 X 10 MG PO TABS: ORAL_TABLET | ORAL | 12 refills | Status: DC

## 2023-07-05 MED ORDER — MEMANTINE HCL 10 MG PO TABS: 10.0000 mg | ORAL_TABLET | Freq: Two times a day (BID) | ORAL | 5 refills | Status: DC

## 2023-07-05 NOTE — Patient Instructions (Signed)
 I had a long d/w patient and her daughter about his recent episode of confusion, silent cerebellar stroke,mild cognitive impairment and family history of Alzheimer`s dementia, risk for recurrent stroke/TIAs, personally independently reviewed imaging studies and stroke evaluation results and answered questions.Continue Plavix  75 mg daily for secondary stroke prevention and maintain strict control of hypertension with blood pressure goal below 130/90, diabetes with hemoglobin A1c goal below 6.5% and lipids with LDL cholesterol goal below 70 mg/dL. I also advised the patient to eat a healthy diet with plenty of whole grains, cereals, fruits and vegetables, exercise regularly and maintain ideal body weight .recommend check ATN profile, apoprotein E levels and PET amyloid to assess risk for Alzheimer's.  If high risk for Alzheimer's is confirmed on the test may be a candidate for Kisunla or Lequembi in the future.  Trial of Namenda starter pack and if tolerated 10 mg twice daily to help with cognitive impairment and memory loss.  I also encouraged him to increase participation in cognitively challenging activities like solving crossword puzzles, playing bridge and sudoku.  We also discussed memory compensation strategies.  Followup in the future with me in 4 months or call earlier if necessary.  Memory Compensation Strategies  Use "WARM" strategy.  W= write it down  A= associate it  R= repeat it  M= make a mental note  2.   You can keep a Glass blower/designer.  Use a 3-ring notebook with sections for the following: calendar, important names and phone numbers,  medications, doctors' names/phone numbers, lists/reminders, and a section to journal what you did  each day.   3.    Use a calendar to write appointments down.  4.    Write yourself a schedule for the day.  This can be placed on the calendar or in a separate section of the Memory Notebook.  Keeping a  regular schedule can help memory.  5.    Use  medication organizer with sections for each day or morning/evening pills.  You may need help loading it  6.    Keep a basket, or pegboard by the door.  Place items that you need to take out with you in the basket or on the pegboard.  You may also want to  include a message board for reminders.  7.    Use sticky notes.  Place sticky notes with reminders in a place where the task is performed.  For example: " turn off the  stove" placed by the stove, "lock the door" placed on the door at eye level, " take your medications" on  the bathroom mirror or by the place where you normally take your medications.  8.    Use alarms/timers.  Use while cooking to remind yourself to check on food or as a reminder to take your medicine, or as a  reminder to make a call, or as a reminder to perform another task, etc.

## 2023-07-05 NOTE — Progress Notes (Signed)
 [facility] [address] [place]. [place] [address] [phone]       OFFICE CONSULT NOTE  [name] Date of Birth:  [birth year] Medical Record Number:  [id number]   Referring MD: Maylene Spear  Reason for Referral: Confusion episode  HPI: Jeffrey Stevens is a pleasant 73 year old African-American male seen today for initial office consultation visit.  He is accompanied by his daughter.  History is obtained from them and review of electronic medical records.  I personally reviewed pertinent available imaging films in PACS.  He has past medical history of diabetes, hypertension, hyperlipidemia who presented on 05/04/2023 to the hospital for evaluation for sudden onset of confusion, disorientation and memory loss according to family.  They were concerned about TIA.  CT head did not show any acute abnormalities on MRI scan showed no acute infarct but old left cerebellar infarct.  CT angiograms did not show any large vessel stenosis or occlusion.  EEG was normal..  Patient daughter did admit to some baseline cognitive impairment.  Lab work for reversible causes of memory loss including vitamin B12, TSH, RPR and HIV were all normal.  LDL cholesterol 78 mg percent.  Hemoglobin A1c was 8.1.  Echocardiogram showed normal ejection fraction.  MRI scan of cervical thoracic and lumbar spine was also obtained which showed evidence of degenerative arthritis but no significant spinal stenosis cord compression or root or foraminal encroachment.  Patient was referred to outpatient neurology for cognitive workup.  The patient's daughter is states that she has had some mild memory and cognitive issues for couple of years but they seem to have progressed now.  Patient however is still quite independent and is working full-time.  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  He is also driving his personal vehicle to work without incident.  He does admit to occasionally getting  lost while driving and missing work done.  He does have a family history of Alzheimer's in his sister and is worried about developing back.  He denies any delusions, hallucinations agitation or unsafe behavior.  He denies any significant headaches, focal extremity weakness ,numbness ,prior history of stroke TIA or significant head injury with loss of consciousness.  ROS:   14 system review of systems is positive for confusion, disorientation, memory loss, speech difficulty, unsteady gait all other systems negative  PMH:  Past Medical History:  Diagnosis Date   Diabetes mellitus without complication (HCC)    Dyslipidemia    Hypertension    Pneumonia 05/23/2014    Social History:  Social History   Socioeconomic History   Marital status: Legally Separated    Spouse name: Not on file   Number of children: Not on file   Years of education: Not on file   Highest education level: Not on file  Occupational History   Not on file  Tobacco Use   Smoking status: Every Day    Current packs/day: 1.00    Average packs/day: 1 pack/day for 50.0 years (50.0 ttl pk-yrs)    Types: Cigarettes   Smokeless tobacco: Never  Vaping Use   Vaping status: Never Used  Substance and Sexual Activity   Alcohol use: No   Drug use: No   Sexual activity: Not on file  Other Topics Concern   Not on file  Social History Narrative   Not on file   Social Drivers of Health   Financial Resource Strain: Not on file  Food Insecurity: No Food  Insecurity (05/05/2023)   Hunger Vital Sign    Worried About Running Out of Food in the Last Year: Never true    Ran Out of Food in the Last Year: Never true  Transportation Needs: No Transportation Needs (05/05/2023)   PRAPARE - Administrator, Civil Service (Medical): No    Lack of Transportation (Non-Medical): No  Physical Activity: Not on file  Stress: Not on file  Social Connections: Socially Isolated (05/05/2023)   Social Connection and Isolation Panel  [NHANES]    Frequency of Communication with Friends and Family: Twice a week    Frequency of Social Gatherings with Friends and Family: Three times a week    Attends Religious Services: Never    Active Member of Clubs or Organizations: No    Attends Banker Meetings: Never    Marital Status: Separated  Intimate Partner Violence: Not At Risk (05/05/2023)   Humiliation, Afraid, Rape, and Kick questionnaire    Fear of Current or Ex-Partner: No    Emotionally Abused: No    Physically Abused: No    Sexually Abused: No    Medications:   Current Outpatient Medications on File Prior to Visit  Medication Sig Dispense Refill   clopidogrel  (PLAVIX ) 75 MG tablet Take 1 tablet (75 mg total) by mouth daily. 30 tablet 3   Continuous Glucose Sensor (FREESTYLE LIBRE 3 PLUS SENSOR) MISC by Does not apply route. Change sensor every 15 days.     ferrous sulfate 325 (65 FE) MG tablet Take 325 mg by mouth daily with breakfast.     insulin  glargine, 1 Unit Dial, (TOUJEO  SOLOSTAR) 300 UNIT/ML Solostar Pen Inject 30 Units into the skin at bedtime.     lisinopril -hydrochlorothiazide  (PRINZIDE ,ZESTORETIC ) 20-12.5 MG per tablet Take 1 tablet by mouth daily. 30 tablet 0   Multiple Vitamin (MULTIVITAMIN WITH MINERALS) TABS tablet Take 1 tablet by mouth daily.     pioglitazone (ACTOS) 15 MG tablet Take 15 mg by mouth daily.     rosuvastatin  (CRESTOR ) 20 MG tablet Take 1 tablet (20 mg total) by mouth daily. 30 tablet 1   ACCU-CHEK SOFTCLIX LANCETS lancets  (Patient not taking: Reported on 07/05/2023)     BD PEN NEEDLE NANO U/F 32G X 4 MM MISC See admin instructions. (Patient not taking: Reported on 07/05/2023)  4   Budeson-Glycopyrrol-Formoterol  (BREZTRI AEROSPHERE) 160-9-4.8 MCG/ACT AERO Inhale 2 puffs into the lungs at bedtime. (Patient not taking: Reported on 07/05/2023)     JANUMET 50-1000 MG tablet Take 1 tablet by mouth 2 (two) times daily. (Patient not taking: Reported on 07/05/2023)     mirtazapine  (REMERON) 7.5 MG tablet Take 7.5 mg by mouth at bedtime. (Patient not taking: Reported on 07/05/2023)     ONE TOUCH ULTRA TEST test strip  (Patient not taking: Reported on 07/05/2023)     Current Facility-Administered Medications on File Prior to Visit  Medication Dose Route Frequency Provider Last Rate Last Admin   acetaminophen  (TYLENOL ) tablet 975 mg  975 mg Oral Once Copland, Jessica C, MD        Allergies:  No Known Allergies  Physical Exam General: well developed, well nourished pleasant elderly African-American male, seated, in no evident distress Head: head normocephalic and atraumatic.   Neck: supple with no carotid or supraclavicular bruits Cardiovascular: regular rate and rhythm, no murmurs Musculoskeletal: no deformity Skin:  no rash/petichiae Vascular:  Normal pulses all extremities  Neurologic Exam Mental Status: Awake and fully alert. Oriented to place  and time. Recent and remote memory intact. Attention span, concentration and fund of knowledge appropriate. Mood and affect appropriate.  Diminished recall 1/3.  Able to name 9 animals which can walk on 4 legs.  Clock drawing 3/4.  Mini-Mental status exam score 24. Cranial Nerves: Fundoscopic exam reveals sharp disc margins. Pupils equal, briskly reactive to light. Extraocular movements full without nystagmus. Visual fields full to confrontation. Hearing intact. Facial sensation intact. Face, tongue, palate moves normally and symmetrically.  Motor: Normal bulk and tone. Normal strength in all tested extremity muscles. Sensory.: intact to touch , pinprick , position and vibratory sensation.  Coordination: Rapid alternating movements normal in all extremities. Finger-to-nose and heel-to-shin performed accurately bilaterally. Gait and Station: Arises from chair without difficulty. Stance is normal. Gait demonstrates normal stride length and balance . Able to heel, toe and tandem walk without difficulty.  Reflexes: 1+ and symmetric.  Toes downgoing.      07/05/2023    8:20 AM  MMSE - Mini Mental State Exam  Orientation to time 4  Orientation to Place 4  Registration 3  Attention/ Calculation 2  Recall 2  Language- name 2 objects 2  Language- repeat 1  Language- follow 3 step command 3  Language- read & follow direction 1  Write a sentence 1  Copy design 1  Total score 24      ASSESSMENT: 73 year old African-American male with confusion in March 2025 of unclear etiology with abnormal MRI scan suggesting silent cerebellar infarct and clinical exam suggesting  mild cognitive impairment versus early dementia . He has  family history of Alzheimer's.    PLAN:I had a long d/w patient and her daughter about his recent episode of confusion, silent cerebellar stroke,mild cognitive impairment and family history of Alzheimer`s dementia, risk for recurrent stroke/TIAs, personally independently reviewed imaging studies and stroke evaluation results and answered questions.Continue Plavix  75 mg daily for secondary stroke prevention and maintain strict control of hypertension with blood pressure goal below 130/90, diabetes with hemoglobin A1c goal below 6.5% and lipids with LDL cholesterol goal below 70 mg/dL. I also advised the patient to eat a healthy diet with plenty of whole grains, cereals, fruits and vegetables, exercise regularly and maintain ideal body weight .recommend check ATN profile, apoprotein E levels and PET amyloid to assess risk for Alzheimer's.  If high risk for Alzheimer's is confirmed on the test may be a candidate for Kisunla or Lequembi in the future.  Trial of Namenda starter pack and if tolerated 10 mg twice daily to help with cognitive impairment and memory loss.  I also encouraged him to increase participation in cognitively challenging activities like solving crossword puzzles, playing bridge and sudoku.  We also discussed memory compensation strategies.  Followup in the future with me in 4 months or call  earlier if necessary. Greater than 50% time during this prolonged 60-minute visit was spent in counseling and coordination of care about his episode of confusion as well as silent cerebellar stroke and discussion about mild cognitive impairment versus early dementia and risk for Alzheimer's and answering questions Jeffrey Beaver, MD Note: This document was prepared with digital dictation and possible smart phrase technology. Any transcriptional errors that result from this process are unintentional.

## 2023-07-08 ENCOUNTER — Ambulatory Visit: Payer: Self-pay | Admitting: Neurology

## 2023-07-08 NOTE — Progress Notes (Signed)
 Kindly inform the patient that his blood work does not suggest the presence of Alzheimer's related pathology and it is good news

## 2023-07-11 NOTE — Telephone Encounter (Signed)
 Called the daughter. There was no answer. Left a detailed message advising of the lab results. Informed the genetic test is still pending but if that comes back abnormal, we will be in touch and let them know. Advised if they have questions they can call us  back.

## 2023-07-11 NOTE — Telephone Encounter (Signed)
-----   Message from [name] sent at 07/08/2023  3:39 PM EDT ----- Jeffrey Stevens inform the patient that his blood work does not suggest the presence of Alzheimer's related pathology and it is good news

## 2023-07-13 ENCOUNTER — Telehealth: Payer: Self-pay | Admitting: Neurology

## 2023-07-13 NOTE — Telephone Encounter (Signed)
 [employer] medicare [place]: [id number] exp. 07/13/23-08/11/23 scheduled on May 30.

## 2023-07-19 LAB — APOE ALZHEIMER'S RISK

## 2023-07-19 LAB — ATN PROFILE
A -- Beta-amyloid 42/40 Ratio: 0.109 (ref >0.102)
Beta-amyloid 40: 171.28 pg/mL
Beta-amyloid 42: 18.71 pg/mL
N -- NfL, Plasma: 2.77 pg/mL (ref 0.00–6.04)
T -- p-tau181: 0.66 pg/mL (ref 0.00–0.97)

## 2023-07-21 ENCOUNTER — Ambulatory Visit (HOSPITAL_COMMUNITY)
Admission: RE | Admit: 2023-07-21 | Discharge: 2023-07-21 | Disposition: A | Discharge status: Home / Self Care | Source: Ambulatory Visit | Attending: Neurology | Admitting: Neurology

## 2023-07-21 DIAGNOSIS — R269 Unspecified abnormalities of gait and mobility: Secondary | ICD-10-CM | POA: Diagnosis not present

## 2023-07-21 DIAGNOSIS — R4789 Other speech disturbances: Secondary | ICD-10-CM | POA: Insufficient documentation

## 2023-07-21 DIAGNOSIS — G309 Alzheimer's disease, unspecified: Secondary | ICD-10-CM | POA: Diagnosis present

## 2023-07-21 DIAGNOSIS — R413 Other amnesia: Secondary | ICD-10-CM | POA: Insufficient documentation

## 2023-07-21 DIAGNOSIS — R410 Disorientation, unspecified: Secondary | ICD-10-CM | POA: Diagnosis present

## 2023-07-21 DIAGNOSIS — F0390 Unspecified dementia without behavioral disturbance: Secondary | ICD-10-CM | POA: Insufficient documentation

## 2023-07-21 MED ORDER — FLORBETAPIR F 18 500-1900 MBQ/ML IV SOLN
9.0220 | Freq: Once | INTRAVENOUS | Status: AC
  Administered 2023-07-21: 9.022 via INTRAVENOUS

## 2023-09-25 DIAGNOSIS — E1165 Type 2 diabetes mellitus with hyperglycemia: Secondary | ICD-10-CM | POA: Diagnosis not present

## 2023-09-28 DIAGNOSIS — E1151 Type 2 diabetes mellitus with diabetic peripheral angiopathy without gangrene: Secondary | ICD-10-CM | POA: Diagnosis not present

## 2023-09-28 DIAGNOSIS — M19071 Primary osteoarthritis, right ankle and foot: Secondary | ICD-10-CM | POA: Diagnosis not present

## 2023-10-04 DIAGNOSIS — E1169 Type 2 diabetes mellitus with other specified complication: Secondary | ICD-10-CM | POA: Diagnosis not present

## 2023-10-04 DIAGNOSIS — R634 Abnormal weight loss: Secondary | ICD-10-CM | POA: Diagnosis not present

## 2023-10-04 DIAGNOSIS — J069 Acute upper respiratory infection, unspecified: Secondary | ICD-10-CM | POA: Diagnosis not present

## 2023-10-17 ENCOUNTER — Encounter: Payer: Self-pay | Admitting: Internal Medicine

## 2023-10-20 ENCOUNTER — Other Ambulatory Visit: Payer: Self-pay

## 2023-10-20 ENCOUNTER — Inpatient Hospital Stay

## 2023-10-20 ENCOUNTER — Ambulatory Visit: Payer: Self-pay | Admitting: Physician Assistant

## 2023-10-20 ENCOUNTER — Telehealth: Payer: Self-pay | Admitting: Pharmacy Technician

## 2023-10-20 ENCOUNTER — Other Ambulatory Visit: Payer: Self-pay | Admitting: *Deleted

## 2023-10-20 ENCOUNTER — Encounter: Payer: Self-pay | Admitting: Physician Assistant

## 2023-10-20 ENCOUNTER — Telehealth: Payer: Self-pay

## 2023-10-20 ENCOUNTER — Inpatient Hospital Stay: Attending: Physician Assistant | Admitting: Physician Assistant

## 2023-10-20 VITALS — BP 116/52 | HR 100 | Temp 98.1°F | Resp 16 | Ht 69.0 in | Wt 126.0 lb

## 2023-10-20 DIAGNOSIS — D508 Other iron deficiency anemias: Secondary | ICD-10-CM

## 2023-10-20 DIAGNOSIS — Z87891 Personal history of nicotine dependence: Secondary | ICD-10-CM | POA: Diagnosis not present

## 2023-10-20 DIAGNOSIS — D509 Iron deficiency anemia, unspecified: Secondary | ICD-10-CM | POA: Diagnosis present

## 2023-10-20 LAB — CMP (CANCER CENTER ONLY)
ALT: 12 U/L (ref 0–44)
AST: 12 U/L — ABNORMAL LOW (ref 15–41)
Albumin: 4.2 g/dL (ref 3.5–5.0)
Alkaline Phosphatase: 41 U/L (ref 38–126)
Anion gap: 8 (ref 5–15)
BUN: 14 mg/dL (ref 8–23)
CO2: 26 mmol/L (ref 22–32)
Calcium: 9.5 mg/dL (ref 8.9–10.3)
Chloride: 92 mmol/L — ABNORMAL LOW (ref 98–111)
Creatinine: 1 mg/dL (ref 0.61–1.24)
GFR, Estimated: >60 mL/min (ref >60)
Glucose, Bld: 254 mg/dL — ABNORMAL HIGH (ref 70–99)
Potassium: 4.6 mmol/L (ref 3.5–5.1)
Sodium: 126 mmol/L — ABNORMAL LOW (ref 135–145)
Total Bilirubin: 0.3 mg/dL (ref 0.0–1.2)
Total Protein: 7.3 g/dL (ref 6.5–8.1)

## 2023-10-20 LAB — CBC WITH DIFFERENTIAL (CANCER CENTER ONLY)
Abs Immature Granulocytes: 0.03 K/uL (ref 0.00–0.07)
Basophils Absolute: 0 K/uL (ref 0.0–0.1)
Basophils Relative: 1 %
Eosinophils Absolute: 0.1 K/uL (ref 0.0–0.5)
Eosinophils Relative: 2 %
HCT: 22.7 % — ABNORMAL LOW (ref 39.0–52.0)
Hemoglobin: 7 g/dL — ABNORMAL LOW (ref 13.0–17.0)
Immature Granulocytes: 1 %
Lymphocytes Relative: 14 %
Lymphs Abs: 0.7 K/uL (ref 0.7–4.0)
MCH: 25 pg — ABNORMAL LOW (ref 26.0–34.0)
MCHC: 30.8 g/dL (ref 30.0–36.0)
MCV: 81.1 fL (ref 80.0–100.0)
Monocytes Absolute: 0.7 K/uL (ref 0.1–1.0)
Monocytes Relative: 14 %
Neutro Abs: 3.4 K/uL (ref 1.7–7.7)
Neutrophils Relative %: 68 %
Platelet Count: 301 K/uL (ref 150–400)
RBC: 2.8 MIL/uL — ABNORMAL LOW (ref 4.22–5.81)
RDW: 16.3 % — ABNORMAL HIGH (ref 11.5–15.5)
WBC Count: 4.9 K/uL (ref 4.0–10.5)
nRBC: 0 % (ref 0.0–0.2)

## 2023-10-20 LAB — RETIC PANEL
Immature Retic Fract: 30.9 % — ABNORMAL HIGH (ref 2.3–15.9)
RBC.: 2.75 MIL/uL — ABNORMAL LOW (ref 4.22–5.81)
Retic Count, Absolute: 68.8 K/uL (ref 19.0–186.0)
Retic Ct Pct: 2.5 % (ref 0.4–3.1)
Reticulocyte Hemoglobin: 24.3 pg — ABNORMAL LOW (ref >27.9)

## 2023-10-20 LAB — SAMPLE TO BLOOD BANK

## 2023-10-20 LAB — IRON AND IRON BINDING CAPACITY (CC-WL,HP ONLY)
Iron: 13 ug/dL — ABNORMAL LOW (ref 45–182)
Saturation Ratios: 3 % — ABNORMAL LOW (ref 17.9–39.5)
TIBC: 468 ug/dL — ABNORMAL HIGH (ref 250–450)
UIBC: 455 ug/dL — ABNORMAL HIGH (ref 117–376)

## 2023-10-20 LAB — ABO/RH: ABO/RH(D): A POS

## 2023-10-20 LAB — FERRITIN: Ferritin: 13 ng/mL — ABNORMAL LOW (ref 24–336)

## 2023-10-20 LAB — PREPARE RBC (CROSSMATCH)

## 2023-10-20 MED ORDER — SODIUM CHLORIDE 0.9% IV SOLUTION
250.0000 mL | INTRAVENOUS | Status: DC
  Administered 2023-10-20: 250 mL via INTRAVENOUS

## 2023-10-20 MED ORDER — ACETAMINOPHEN 325 MG PO TABS
650.0000 mg | ORAL_TABLET | Freq: Once | ORAL | Status: AC
  Administered 2023-10-20: 650 mg via ORAL
  Filled 2023-10-20: qty 2

## 2023-10-20 NOTE — Telephone Encounter (Signed)
 Auth Submission: NO AUTH NEEDED Site of care: Site of care: [facility] Payer: BCBS MEDICARE Medication & CPT/J Code(s) submitted: Feraheme  (ferumoxytol ) [id number] Diagnosis Code: D50.9 Route of submission (phone, fax, portal):  Phone # Fax # Auth type: Buy/Bill PB Units/visits requested: X2 DOSES Reference number:  Approval from: 10/20/23 to 01/20/24

## 2023-10-20 NOTE — Progress Notes (Signed)
 1600 - blood sugar is 237, checked per patient family request.

## 2023-10-20 NOTE — Patient Instructions (Signed)
 [facility] - A DEPT OF [facility]  Discharge Instructions: Thank you for choosing [facility] to provide your oncology and hematology care.   If you have a lab appointment with the Cancer Center, please go directly to the Cancer Center and check in at the registration area.   Wear comfortable clothing and clothing appropriate for easy access to any Portacath or PICC line.   We strive to give you quality time with your provider. You may need to reschedule your appointment if you arrive late (15 or more minutes).  Arriving late affects you and other patients whose appointments are after yours.  Also, if you miss three or more appointments without notifying the office, you may be dismissed from the clinic at the provider's discretion.      For prescription refill requests, have your pharmacy contact our office and allow 72 hours for refills to be completed.    Today you received the following: one unit of blood   To help prevent nausea and vomiting after your treatment, we encourage you to take your nausea medication as directed.  BELOW ARE SYMPTOMS THAT SHOULD BE REPORTED IMMEDIATELY: *FEVER GREATER THAN 100.4 F (38 C) OR HIGHER *CHILLS OR SWEATING *NAUSEA AND VOMITING THAT IS NOT CONTROLLED WITH YOUR NAUSEA MEDICATION *UNUSUAL SHORTNESS OF BREATH *UNUSUAL BRUISING OR BLEEDING *URINARY PROBLEMS (pain or burning when urinating, or frequent urination) *BOWEL PROBLEMS (unusual diarrhea, constipation, pain near the anus) TENDERNESS IN MOUTH AND THROAT WITH OR WITHOUT PRESENCE OF ULCERS (sore throat, sores in mouth, or a toothache) UNUSUAL RASH, SWELLING OR PAIN  UNUSUAL VAGINAL DISCHARGE OR ITCHING   Items with * indicate a potential emergency and should be followed up as soon as possible or go to the Emergency Department if any problems should occur.  Please show the CHEMOTHERAPY ALERT CARD or IMMUNOTHERAPY ALERT CARD at check-in to the  Emergency Department and triage nurse.  Should you have questions after your visit or need to cancel or reschedule your appointment, please contact [facility] - A DEPT OF [facility]  Dept: [phone]  and follow the prompts.  Office hours are 8:00 a.m. to 4:30 p.m. Monday - Friday. Please note that voicemails left after 4:00 p.m. may not be returned until the following business day.  We are closed weekends and major holidays. You have access to a nurse at all times for urgent questions. Please call the main number to the clinic Dept: [phone] and follow the prompts.   For any non-urgent questions, you may also contact your provider using MyChart. We now offer e-Visits for anyone 34 and older to request care online for non-urgent symptoms. For details visit [email or web address].   Also download the MyChart app! Go to the app store, search MyChart, open the app, select [place], and log in with your MyChart username and password.

## 2023-10-20 NOTE — Telephone Encounter (Signed)
 Contacted Dr. Efren [email or web address] , T# [phone] to request pt's recent EGD and Colonoscopy report, per Johnston Police, PA-C. Spoke w/ Glade. Glade agreed to fax report to F# [phone].  Awaiting report.

## 2023-10-20 NOTE — Progress Notes (Signed)
 [facility] [phone]   [phone]  INITIAL CONSULT NOTE  Patient Care Team: Benjamine Aland, MD as PCP - General (Family Medicine)  CHIEF COMPLAINTS/PURPOSE OF CONSULTATION:  Iron deficiency anemia  HISTORY OF PRESENTING ILLNESS:  Jeffrey Stevens 73 y.o. male with medical history significant for DM, hyperlipidemia and hypertension presents to the hematology clinic for evaluation of iron deficiency anemia. Patient was previously seen by Dr. Sherrod last in May 2019 for the same condition. He is accompanied by his daughter for this visit.   On review of the previous records, there is evidence of iron deficiency as far back as April 2015 where he received periodic IV iron infusion and has been on long term PO iron therapy. Most recent labs from 10/12/2023 showed Hgb 7.0, MCV 86.4, ferritin 8.   On exam today, Jeffrey Stevens reports having progressive fatigue over the past severy months which has impacted his ADLs. He reports more noticeable shortness of breath on exertion and at rest. He does crave ice throughout the day. He denies overt signs of bleeding including hematochezia or melena. He underwent reports undergoing EGD and colonoscopy last month with GI without any evidence of GI bleeding. He denies fevers, chills, sweats, chest pain, cough, headaches or dizziness. He has no other complaints. Rest of the ROS is below.   MEDICAL HISTORY:  Past Medical History:  Diagnosis Date   Diabetes mellitus without complication (HCC)    Dyslipidemia    Hypertension    Pneumonia 05/23/2014    SURGICAL HISTORY: Past Surgical History:  Procedure Laterality Date   COLONOSCOPY  2015    SOCIAL HISTORY: Social History   Socioeconomic History   Marital status: Legally Separated    Spouse name: Not on file   Number of children: Not on file   Years of education: Not on file   Highest education level: Not on file  Occupational History   Not on file  Tobacco Use    Smoking status: Former    Current packs/day: 1.00    Average packs/day: 1 pack/day for 50.0 years (50.0 ttl pk-yrs)    Types: Cigarettes   Smokeless tobacco: Never   Tobacco comments:    Quit smoking early 2025.   Vaping Use   Vaping status: Never Used  Substance and Sexual Activity   Alcohol use: No   Drug use: No   Sexual activity: Not on file  Other Topics Concern   Not on file  Social History Narrative   Not on file   Social Drivers of Health   Financial Resource Strain: Not on file  Food Insecurity: No Food Insecurity (10/20/2023)   Hunger Vital Sign    Worried About Running Out of Food in the Last Year: Never true    Ran Out of Food in the Last Year: Never true  Transportation Needs: No Transportation Needs (10/20/2023)   PRAPARE - Administrator, Civil Service (Medical): No    Lack of Transportation (Non-Medical): No  Physical Activity: Not on file  Stress: Not on file  Social Connections: Socially Isolated (05/05/2023)   Social Connection and Isolation Panel    Frequency of Communication with Friends and Family: Twice a week    Frequency of Social Gatherings with Friends and Family: Three times a week    Attends Religious Services: Never    Active Member of Clubs or Organizations: No    Attends Banker Meetings: Never    Marital Status: Separated  Intimate Partner Violence: Not At Risk (10/20/2023)   Humiliation, Afraid, Rape, and Kick questionnaire    Fear of Current or Ex-Partner: No    Emotionally Abused: No    Physically Abused: No    Sexually Abused: No    FAMILY HISTORY: Family History  Problem Relation Age of Onset   Diabetes Mother    Diabetes Father    Pancreatic cancer Father    Breast cancer Sister     ALLERGIES:  has no known allergies.  MEDICATIONS:  Current Outpatient Medications  Medication Sig Dispense Refill   ACCU-CHEK SOFTCLIX LANCETS lancets  (Patient not taking: Reported on 07/05/2023)     BD PEN NEEDLE  NANO U/F 32G X 4 MM MISC See admin instructions. (Patient not taking: Reported on 07/05/2023)  4   Budeson-Glycopyrrol-Formoterol  (BREZTRI AEROSPHERE) 160-9-4.8 MCG/ACT AERO Inhale 2 puffs into the lungs at bedtime. (Patient not taking: Reported on 07/05/2023)     clopidogrel  (PLAVIX ) 75 MG tablet Take 1 tablet (75 mg total) by mouth daily. 30 tablet 3   Continuous Glucose Sensor (FREESTYLE LIBRE 3 PLUS SENSOR) MISC by Does not apply route. Change sensor every 15 days.     ferrous sulfate 325 (65 FE) MG tablet Take 325 mg by mouth daily with breakfast.     insulin  glargine, 1 Unit Dial, (TOUJEO  SOLOSTAR) 300 UNIT/ML Solostar Pen Inject 30 Units into the skin at bedtime.     JANUMET 50-1000 MG tablet Take 1 tablet by mouth 2 (two) times daily. (Patient not taking: Reported on 07/05/2023)     lisinopril -hydrochlorothiazide  (PRINZIDE ,ZESTORETIC ) 20-12.5 MG per tablet Take 1 tablet by mouth daily. 30 tablet 0   memantine  (NAMENDA  TITRATION PAK) tablet pack 5 mg/day for =1 week; 5 mg twice daily for =1 week; 15 mg/day given in 5 mg and 10 mg separated doses for =1 week; then 10 mg twice daily 49 tablet 12   memantine  (NAMENDA ) 10 MG tablet Take 1 tablet (10 mg total) by mouth 2 (two) times daily. 60 tablet 5   mirtazapine (REMERON) 7.5 MG tablet Take 7.5 mg by mouth at bedtime. (Patient not taking: Reported on 07/05/2023)     Multiple Vitamin (MULTIVITAMIN WITH MINERALS) TABS tablet Take 1 tablet by mouth daily.     ONE TOUCH ULTRA TEST test strip  (Patient not taking: Reported on 07/05/2023)     pioglitazone (ACTOS) 15 MG tablet Take 15 mg by mouth daily.     rosuvastatin  (CRESTOR ) 20 MG tablet Take 1 tablet (20 mg total) by mouth daily. 30 tablet 1   Current Facility-Administered Medications  Medication Dose Route Frequency Provider Last Rate Last Admin   acetaminophen  (TYLENOL ) tablet 975 mg  975 mg Oral Once Copland, Jessica C, MD       Facility-Administered Medications Ordered in Other Visits   Medication Dose Route Frequency Provider Last Rate Last Admin   0.9 %  sodium chloride  infusion (Manually program via Guardrails IV Fluids)  250 mL Intravenous Continuous Brantlee Hinde T, PA-C 10 mL/hr at 10/20/23 1406 250 mL at 10/20/23 1406    REVIEW OF SYSTEMS:   Constitutional: ( - ) fevers, ( - )  chills , ( - ) night sweats Eyes: ( - ) blurriness of vision, ( - ) double vision, ( - ) watery eyes Ears, nose, mouth, throat, and face: ( - ) mucositis, ( - ) sore throat Respiratory: ( - ) cough, ( - ) dyspnea, ( - ) wheezes Cardiovascular: ( - ) palpitation, ( - )  chest discomfort, ( - ) lower extremity swelling Gastrointestinal:  ( - ) nausea, ( - ) heartburn, ( - ) change in bowel habits Skin: ( - ) abnormal skin rashes Lymphatics: ( - ) new lymphadenopathy, ( - ) easy bruising Neurological: ( - ) numbness, ( - ) tingling, ( - ) new weaknesses Behavioral/Psych: ( - ) mood change, ( - ) new changes  All other systems were reviewed with the patient and are negative.  PHYSICAL EXAMINATION: ECOG PERFORMANCE STATUS: 1 - Symptomatic but completely ambulatory  Vitals:   10/20/23 1116  BP: (!) 116/52  Pulse: 100  Resp: 16  Temp: 98.1 F (36.7 C)  SpO2: 99%   Filed Weights   10/20/23 1116  Weight: 126 lb (57.2 kg)    GENERAL: well appearing male in NAD  SKIN: skin color, texture, turgor are normal, no rashes or significant lesions EYES: conjunctiva are pink and non-injected, sclera clear LUNGS: clear to auscultation and percussion with normal breathing effort HEART: regular rate & rhythm and no murmurs and no lower extremity edema Musculoskeletal: no cyanosis of digits and no clubbing  PSYCH: alert & oriented x 3, fluent speech NEURO: no focal motor/sensory deficits  LABORATORY DATA:  I have reviewed the data as listed    Latest Ref Rng & Units 10/20/2023   12:29 PM 05/06/2023    5:50 AM 05/04/2023    9:08 PM  CBC  WBC 4.0 - 10.5 K/uL 4.9  4.8  6.3   Hemoglobin 13.0 -  17.0 g/dL 7.0  87.7  89.0   Hematocrit 39.0 - 52.0 % 22.7  35.6  32.5   Platelets 150 - 400 K/uL 301  168  166        Latest Ref Rng & Units 10/20/2023   12:29 PM 05/06/2023    5:50 AM 05/04/2023   10:17 PM  CMP  Glucose 70 - 99 mg/dL 745  746  835   BUN 8 - 23 mg/dL 14  11  22    Creatinine 0.61 - 1.24 mg/dL 8.99  9.12  9.03   Sodium 135 - 145 mmol/L 126  138  141   Potassium 3.5 - 5.1 mmol/L 4.6  3.6  3.5   Chloride 98 - 111 mmol/L 92  106  109   CO2 22 - 32 mmol/L 26  23  25    Calcium  8.9 - 10.3 mg/dL 9.5  8.9  9.0   Total Protein 6.5 - 8.1 g/dL 7.3   6.4   Total Bilirubin 0.0 - 1.2 mg/dL 0.3   0.6   Alkaline Phos 38 - 126 U/L 41   36   AST 15 - 41 U/L 12   14   ALT 0 - 44 U/L 12   13     RADIOGRAPHIC STUDIES: I have personally reviewed the radiological images as listed and agreed with the findings in the report. No results found.  ASSESSMENT & PLAN Jeffrey Stevens is a 73 y.o. male who presents to the clinic for evaluation of iron deficiency anemia.   # Iron Deficiency Anemia, etiology unknown --Underwent colonoscopy on 08/28/2023 wit Dr. Elicia with no source for iron deficiency. Will request follow up with Dr. Elicia for EGD +/- capsular endoscopy.  --We will confirm iron deficiency anemia by ordering iron panel and ferritin as well as reticulocytes, CBC, and CMP --Continue ferrous sulfate 325 mg daily with a source of vitamin C --We will plan to proceed with IV iron therapy at [facility] infusion --RTC in 4 weeks for lab check and 8 weeks for labs/follow up  **ADDENDUM: Labs from today confirmed acute anemia with Hgb 7.0. Patient will receive 1 unit of PRBC today.   Orders Placed This Encounter  Procedures   CBC with Differential (Cancer Center Only)    Standing Status:   Future    Number of Occurrences:   1    Expiration Date:   10/19/2024   CMP (Cancer Center only)    Standing Status:   Future    Number of Occurrences:   1    Expiration Date:    10/19/2024   Ferritin    Standing Status:   Future    Number of Occurrences:   1    Expiration Date:   10/19/2024   Iron and Iron Binding Capacity ([facility] only)    Standing Status:   Future    Number of Occurrences:   1    Expiration Date:   10/19/2024   Retic Panel    Standing Status:   Future    Number of Occurrences:   1    Expiration Date:   10/19/2024   CBC with Differential (Cancer Center Only)    Standing Status:   Future    Expected Date:   11/17/2023    Expiration Date:   02/15/2024   Ferritin    Standing Status:   Future    Expected Date:   11/17/2023    Expiration Date:   02/15/2024   Iron and Iron Binding Capacity ([facility] only)    Standing Status:   Future    Expected Date:   11/17/2023    Expiration Date:   02/15/2024   CMP (Cancer Center only)    Standing Status:   Future    Expected Date:   11/17/2023    Expiration Date:   02/15/2024   Informed Consent Details: Physician/Practitioner Attestation; Transcribe to consent form and obtain patient signature    Standing Status:   Future    Number of Occurrences:   1    Expiration Date:   10/19/2024    Physician/Practitioner attestation of informed consent for blood and or blood product transfusion:   I, the physician/practitioner, attest that I have discussed with the patient the benefits, risks, side effects, alternatives, likelihood of achieving goals and potential problems during recovery for the procedure that I have provided informed consent.    Product(s):   All Product(s)   Sample to Blood Bank    Standing Status:   Future    Number of Occurrences:   1    Expiration Date:   10/19/2024   Sample to Blood Bank    Standing Status:   Future    Expected Date:   11/17/2023    Expiration Date:   02/15/2024    All questions were answered. The patient knows to call the clinic with any problems, questions or concerns.  I have spent a total of 60 minutes minutes of face-to-face and non-face-to-face time, preparing to see  the patient, obtaining and/or reviewing separately obtained history, performing a medically appropriate examination, counseling and educating the patient, ordering medications/tests/procedures, referring and communicating with other health care professionals, documenting clinical information in the electronic health record, independently interpreting results and communicating results to the patient, and care coordination.   Johnston Police, PA-C Department of Hematology/Oncology [facility] at [facility] Phone: [phone]  Patient was seen with Dr. Federico  I have read the above note  and personally examined the patient. I agree with the assessment and plan as noted above.  Briefly Jeffrey Stevens is a 73 year old male who presents for evaluation of iron deficiency anemia.  He was previously seen in 2019 by one of our other providers for the same condition.  On most recent check earlier this month he was found to have a ferritin of 8 with a hemoglobin of 7.0.  Due to concern for these findings the patient was referred to our service for further evaluation and management.  At this time findings appear most consistent with iron deficiency secondary to GI bleeding.  He follows with  Dr. Elicia and has undergone colonoscopy, but we would recommend further evaluation with EGD and consideration of capsule endoscopy if a clear etiology cannot be found.  In the interim we will order IV iron therapy in order to help bolster his blood counts.  Additionally because his hemoglobin is still low we will send a sample to blood bank for preparation in case he would require a blood transfusion.   Norleen IVAR Kidney, MD Department of Hematology/Oncology [facility] at [facility] Phone: [phone] Pager: [phone] Email: [email or web address]

## 2023-10-21 ENCOUNTER — Inpatient Hospital Stay

## 2023-10-24 ENCOUNTER — Encounter: Payer: Self-pay | Admitting: Gastroenterology

## 2023-10-24 ENCOUNTER — Telehealth: Payer: Self-pay

## 2023-10-24 LAB — BPAM RBC
Blood Product Expiration Date: 202509252359
ISSUE DATE / TIME: 202508291502
Unit Type and Rh: 6200

## 2023-10-24 LAB — TYPE AND SCREEN
ABO/RH(D): A POS
Antibody Screen: NEGATIVE
Unit division: 0

## 2023-10-24 LAB — GLUCOSE, CAPILLARY: Glucose-Capillary: 237 mg/dL — ABNORMAL HIGH (ref 70–99)

## 2023-10-24 NOTE — Telephone Encounter (Signed)
 Can you forward labs from last week to his GI doc, Dr. Elicia And request urgent follow up IT  Labs faxed and STAT appt requested.  HB

## 2023-10-27 ENCOUNTER — Encounter: Payer: Self-pay | Admitting: Gastroenterology

## 2023-10-31 ENCOUNTER — Encounter: Payer: Self-pay | Admitting: Internal Medicine

## 2023-10-31 ENCOUNTER — Encounter: Payer: Self-pay | Admitting: Physician Assistant

## 2023-11-02 ENCOUNTER — Encounter: Payer: Self-pay | Admitting: Physician Assistant

## 2023-11-02 ENCOUNTER — Encounter: Payer: Self-pay | Admitting: Internal Medicine

## 2023-11-02 ENCOUNTER — Ambulatory Visit (INDEPENDENT_AMBULATORY_CARE_PROVIDER_SITE_OTHER)

## 2023-11-02 VITALS — BP 117/65 | HR 83 | Temp 98.6°F | Resp 16 | Ht 69.0 in | Wt 127.4 lb

## 2023-11-02 DIAGNOSIS — D509 Iron deficiency anemia, unspecified: Secondary | ICD-10-CM | POA: Diagnosis not present

## 2023-11-02 DIAGNOSIS — D508 Other iron deficiency anemias: Secondary | ICD-10-CM

## 2023-11-02 MED ORDER — SODIUM CHLORIDE 0.9 % IV SOLN
510.0000 mg | Freq: Once | INTRAVENOUS | Status: AC
  Administered 2023-11-02: 510 mg via INTRAVENOUS
  Filled 2023-11-02: qty 17

## 2023-11-02 NOTE — Patient Instructions (Signed)

## 2023-11-02 NOTE — Progress Notes (Signed)
 Diagnosis: Iron Deficiency Anemia  Provider:  Praveen Mannam MD  Procedure: IV Infusion  IV Type: Peripheral, IV Location: R Antecubital  Feraheme  (Ferumoxytol ), Dose: 510 mg  Infusion Start Time: 1532  Infusion Stop Time: 1552  Post Infusion IV Care: Observation period completed and Peripheral IV Discontinued  Discharge: Condition: Good, Destination: Home . AVS Provided  Performed by:  Maximiano JONELLE Pouch, LPN

## 2023-11-09 ENCOUNTER — Ambulatory Visit (INDEPENDENT_AMBULATORY_CARE_PROVIDER_SITE_OTHER)

## 2023-11-09 VITALS — BP 122/65 | HR 92 | Temp 98.2°F | Resp 18 | Ht 69.0 in | Wt 126.0 lb

## 2023-11-09 DIAGNOSIS — D508 Other iron deficiency anemias: Secondary | ICD-10-CM

## 2023-11-09 DIAGNOSIS — D509 Iron deficiency anemia, unspecified: Secondary | ICD-10-CM

## 2023-11-09 MED ORDER — SODIUM CHLORIDE 0.9 % IV SOLN
510.0000 mg | Freq: Once | INTRAVENOUS | Status: AC
  Administered 2023-11-09: 510 mg via INTRAVENOUS
  Filled 2023-11-09: qty 17

## 2023-11-09 NOTE — Progress Notes (Signed)
 Diagnosis: Iron Deficiency Anemia  Provider:  Praveen Mannam MD  Procedure: IV Infusion  IV Type: Peripheral, IV Location: R Forearm  Feraheme  (Ferumoxytol ), Dose: 510 mg  Infusion Start Time: 1522  Infusion Stop Time: 1540  Post Infusion IV Care: Patient declined observation and Peripheral IV Discontinued  Discharge: Condition: Good, Destination: Home . AVS Provided and AVS Declined  Performed by:  Dorell Gatlin, RN

## 2023-11-10 ENCOUNTER — Inpatient Hospital Stay: Attending: Physician Assistant

## 2023-11-10 DIAGNOSIS — D509 Iron deficiency anemia, unspecified: Secondary | ICD-10-CM | POA: Diagnosis present

## 2023-11-10 DIAGNOSIS — D508 Other iron deficiency anemias: Secondary | ICD-10-CM

## 2023-11-10 LAB — CBC WITH DIFFERENTIAL (CANCER CENTER ONLY)
Abs Immature Granulocytes: 0.02 K/uL (ref 0.00–0.07)
Basophils Absolute: 0.1 K/uL (ref 0.0–0.1)
Basophils Relative: 1 %
Eosinophils Absolute: 0.3 K/uL (ref 0.0–0.5)
Eosinophils Relative: 5 %
HCT: 32.7 % — ABNORMAL LOW (ref 39.0–52.0)
Hemoglobin: 10.3 g/dL — ABNORMAL LOW (ref 13.0–17.0)
Immature Granulocytes: 0 %
Lymphocytes Relative: 14 %
Lymphs Abs: 0.8 K/uL (ref 0.7–4.0)
MCH: 25.9 pg — ABNORMAL LOW (ref 26.0–34.0)
MCHC: 31.5 g/dL (ref 30.0–36.0)
MCV: 82.2 fL (ref 80.0–100.0)
Monocytes Absolute: 0.5 K/uL (ref 0.1–1.0)
Monocytes Relative: 9 %
Neutro Abs: 4 K/uL (ref 1.7–7.7)
Neutrophils Relative %: 71 %
Platelet Count: 271 K/uL (ref 150–400)
RBC: 3.98 MIL/uL — ABNORMAL LOW (ref 4.22–5.81)
RDW: 19.7 % — ABNORMAL HIGH (ref 11.5–15.5)
WBC Count: 5.7 K/uL (ref 4.0–10.5)
nRBC: 0 % (ref 0.0–0.2)

## 2023-11-10 LAB — CMP (CANCER CENTER ONLY)
ALT: 13 U/L (ref 0–44)
AST: 14 U/L — ABNORMAL LOW (ref 15–41)
Albumin: 4.3 g/dL (ref 3.5–5.0)
Alkaline Phosphatase: 51 U/L (ref 38–126)
Anion gap: 6 (ref 5–15)
BUN: 11 mg/dL (ref 8–23)
CO2: 28 mmol/L (ref 22–32)
Calcium: 10 mg/dL (ref 8.9–10.3)
Chloride: 96 mmol/L — ABNORMAL LOW (ref 98–111)
Creatinine: 1 mg/dL (ref 0.61–1.24)
GFR, Estimated: >60 mL/min
Glucose, Bld: 212 mg/dL — ABNORMAL HIGH (ref 70–99)
Potassium: 4.3 mmol/L (ref 3.5–5.1)
Sodium: 130 mmol/L — ABNORMAL LOW (ref 135–145)
Total Bilirubin: 0.4 mg/dL (ref 0.0–1.2)
Total Protein: 7.6 g/dL (ref 6.5–8.1)

## 2023-11-10 LAB — IRON AND IRON BINDING CAPACITY (CC-WL,HP ONLY)
Iron: 902 ug/dL — ABNORMAL HIGH (ref 45–182)
Saturation Ratios: 247 % — ABNORMAL HIGH (ref 17.9–39.5)
TIBC: 365 ug/dL (ref 250–450)
UIBC: UNDETERMINED ug/dL (ref 117–376)

## 2023-11-10 LAB — FERRITIN: Ferritin: 437 ng/mL — ABNORMAL HIGH (ref 24–336)

## 2023-11-10 LAB — SAMPLE TO BLOOD BANK

## 2023-11-14 ENCOUNTER — Encounter: Payer: Self-pay | Admitting: Internal Medicine

## 2023-11-15 ENCOUNTER — Encounter: Payer: Self-pay | Admitting: Internal Medicine

## 2023-11-22 ENCOUNTER — Ambulatory Visit: Admitting: Neurology

## 2023-11-22 ENCOUNTER — Encounter: Payer: Self-pay | Admitting: Neurology

## 2023-11-22 VITALS — BP 115/64 | HR 108 | Ht 69.0 in | Wt 124.6 lb

## 2023-11-22 DIAGNOSIS — G3184 Mild cognitive impairment, so stated: Secondary | ICD-10-CM

## 2023-11-22 DIAGNOSIS — Z8673 Personal history of transient ischemic attack (TIA), and cerebral infarction without residual deficits: Secondary | ICD-10-CM | POA: Diagnosis not present

## 2023-11-22 NOTE — Progress Notes (Addendum)
 [facility] [address] [place]. [place] [address] [phone]       OFFICE FOLLOW-UP VISIT NOTE  [name] Date of Birth:  August 01, 1950 Medical Record Number:  [id number]   Referring MD: Jeffrey Stevens  Reason for Referral: Confusion episode  HPI: Initial visit 07/05/2023 [name] is a pleasant 73 year old African-American male seen today for initial office consultation visit.  He is accompanied by his daughter.  History is obtained from them and review of electronic medical records.  I personally reviewed pertinent available imaging films in PACS.  He has past medical history of diabetes, hypertension, hyperlipidemia who presented on 05/04/2023 to the hospital for evaluation for sudden onset of confusion, disorientation and memory loss according to family.  They were concerned about TIA.  CT head did not show any acute abnormalities on MRI scan showed no acute infarct but old left cerebellar infarct.  CT angiograms did not show any large vessel stenosis or occlusion.  EEG was normal..  Patient daughter did admit to some baseline cognitive impairment.  Lab work for reversible causes of memory loss including vitamin B12, TSH, RPR and HIV were all normal.  LDL cholesterol 78 mg percent.  Hemoglobin A1c was 8.1.  Echocardiogram showed normal ejection fraction.  MRI scan of cervical thoracic and lumbar spine was also obtained which showed evidence of degenerative arthritis but no significant spinal stenosis cord compression or root or foraminal encroachment.  Patient was referred to outpatient neurology for cognitive workup.  The patient's daughter is states that she has had some mild memory and cognitive issues for couple of years but they seem to have progressed now.  Patient however is still quite independent and is working full-time.  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  He is also driving his personal vehicle to work without incident.  He does  admit to occasionally getting lost while driving and missing work done.  He does have a family history of Alzheimer's in his sister and is worried about developing back.  He denies any delusions, hallucinations agitation or unsafe behavior.  He denies any significant headaches, focal extremity weakness ,numbness ,prior history of stroke TIA or significant head injury with loss of consciousness. Update 11/22/2023 : He returns for follow-up after last visit 5 months ago.  He is accompanied by his daughter.  Patient states he is doing well.  Continues to have short-term memory difficulties and he needs to write things down.  He often misses his appointment.  However is still mostly independent in actives of daily living.  He is still driving but admits that he has difficult time with directions and often gets lost.  He is tolerating Namenda  10 mg twice daily quite well without dizziness or other side effects.  On cognitive testing today scored 24/30.  He had lab work at last visit on 07/05/2023 and ATN profile was negative for Alzheimer's related pathology in the brain.  Lipoprotein a profile showed 1 LDL each of E3 and E4 putting him at slightly higher risk for Alzheimer's.  Amyloid PET scan on 07/11/2023 was negative for Alzheimer's pathology in the brain ROS:   14 system review of systems is positive for confusion, disorientation, memory loss, speech difficulty, unsteady gait all other systems negative  PMH:  Past Medical History:  Diagnosis Date   Diabetes mellitus without complication (HCC)    Dyslipidemia    Hypertension    Pneumonia 05/23/2014    Social History:  Social History   Socioeconomic History   Marital status: Legally Separated    Spouse name: Not on file   Number of children: Not on file   Years of education: Not on file   Highest education level: Not on file  Occupational History   Not on file  Tobacco Use   Smoking status: Former    Current packs/day: 1.00    Average  packs/day: 1 pack/day for 50.0 years (50.0 ttl pk-yrs)    Types: Cigarettes   Smokeless tobacco: Never   Tobacco comments:    Quit smoking early 2025.   Vaping Use   Vaping status: Never Used  Substance and Sexual Activity   Alcohol use: No   Drug use: No   Sexual activity: Not on file  Other Topics Concern   Not on file  Social History Narrative   Pt lives with family    Retired    Social Drivers of Corporate investment banker Strain: Not on file  Food Insecurity: No Food Insecurity (10/20/2023)   Hunger Vital Sign    Worried About Running Out of Food in the Last Year: Never true    Ran Out of Food in the Last Year: Never true  Transportation Needs: No Transportation Needs (10/20/2023)   PRAPARE - Administrator, Civil Service (Medical): No    Lack of Transportation (Non-Medical): No  Physical Activity: Not on file  Stress: Not on file  Social Connections: Socially Isolated (05/05/2023)   Social Connection and Isolation Panel    Frequency of Communication with Friends and Family: Twice a week    Frequency of Social Gatherings with Friends and Family: Three times a week    Attends Religious Services: Never    Active Member of Clubs or Organizations: No    Attends Banker Meetings: Never    Marital Status: Separated  Intimate Partner Violence: Not At Risk (10/20/2023)   Humiliation, Afraid, Rape, and Kick questionnaire    Fear of Current or Ex-Partner: No    Emotionally Abused: No    Physically Abused: No    Sexually Abused: No    Medications:   Current Outpatient Medications on File Prior to Visit  Medication Sig Dispense Refill   ACCU-CHEK SOFTCLIX LANCETS lancets      BD PEN NEEDLE NANO U/F 32G X 4 MM MISC See admin instructions.  4   Budeson-Glycopyrrol-Formoterol  (BREZTRI AEROSPHERE) 160-9-4.8 MCG/ACT AERO Inhale 2 puffs into the lungs at bedtime.     clopidogrel  (PLAVIX ) 75 MG tablet Take 1 tablet (75 mg total) by mouth daily. 30 tablet 3    Continuous Glucose Sensor (FREESTYLE LIBRE 3 PLUS SENSOR) MISC by Does not apply route. Change sensor every 15 days.     ferrous sulfate 325 (65 FE) MG tablet Take 325 mg by mouth daily with breakfast.     insulin  glargine, 1 Unit Dial, (TOUJEO  SOLOSTAR) 300 UNIT/ML Solostar Pen Inject 30 Units into the skin at bedtime.     JANUMET 50-1000 MG tablet Take 1 tablet by mouth 2 (two) times daily.     lisinopril -hydrochlorothiazide  (PRINZIDE ,ZESTORETIC ) 20-12.5 MG per tablet Take 1 tablet by mouth daily. 30 tablet 0   memantine  (NAMENDA ) 10 MG tablet Take 1 tablet (10 mg total) by mouth 2 (two) times daily. 60 tablet 5   mirtazapine (REMERON) 7.5 MG tablet Take 7.5 mg by mouth at bedtime.     Multiple Vitamin (MULTIVITAMIN WITH MINERALS) TABS tablet Take 1 tablet by mouth  daily.     ONE TOUCH ULTRA TEST test strip      pioglitazone (ACTOS) 15 MG tablet Take 15 mg by mouth daily.     rosuvastatin  (CRESTOR ) 20 MG tablet Take 1 tablet (20 mg total) by mouth daily. 30 tablet 1   memantine  (NAMENDA  TITRATION PAK) tablet pack 5 mg/day for =1 week; 5 mg twice daily for =1 week; 15 mg/day given in 5 mg and 10 mg separated doses for =1 week; then 10 mg twice daily 49 tablet 12   Current Facility-Administered Medications on File Prior to Visit  Medication Dose Route Frequency Provider Last Rate Last Admin   acetaminophen  (TYLENOL ) tablet 975 mg  975 mg Oral Once Copland, Jessica C, MD        Allergies:  No Known Allergies  Physical Exam General: well developed, well nourished pleasant elderly African-American male, seated, in no evident distress Head: head normocephalic and atraumatic.   Neck: supple with no carotid or supraclavicular bruits Cardiovascular: regular rate and rhythm, no murmurs Musculoskeletal: no deformity Skin:  no rash/petichiae Vascular:  Normal pulses all extremities  Neurologic Exam Mental Status: Awake and fully alert. Oriented to place and time. Recent and remote memory  intact. Attention span, concentration and fund of knowledge appropriate. Mood and affect appropriate.  Diminished recall 1/3.  Able to name6 animals which can walk on 4 legs.  Clock drawing 4/4.  Mini-Mental status exam score 24. Cranial Nerves: Fundoscopic exam reveals sharp disc margins. Pupils equal, briskly reactive to light. Extraocular movements full without nystagmus. Visual fields full to confrontation. Hearing intact. Facial sensation intact. Face, tongue, palate moves normally and symmetrically.  Motor: Normal bulk and tone. Normal strength in all tested extremity muscles. Sensory.: intact to touch , pinprick , position and vibratory sensation.  Coordination: Rapid alternating movements normal in all extremities. Finger-to-nose and heel-to-shin performed accurately bilaterally. Gait and Station: Arises from chair without difficulty. Stance is normal. Gait demonstrates normal stride length and balance . Able to heel, toe and tandem walk without difficulty.  Reflexes: 1+ and symmetric. Toes downgoing.      11/22/2023    3:44 PM 07/05/2023    8:20 AM  MMSE - Mini Mental State Exam  Orientation to time 4 4  Orientation to Place 4 4  Registration 3 3  Attention/ Calculation 3 2  Recall 3 2  Language- name 2 objects 2 2  Language- repeat 1 1  Language- follow 3 step command 3 3  Language- read & follow direction 1 1  Write a sentence 0 1  Copy design 0 1  Total score 24 24      ASSESSMENT: 73 year old African-American male with confusion in March 2025 of unclear etiology with abnormal MRI scan suggesting silent cerebellar infarct and clinical exam suggesting  mild cognitive impairment versus early dementia . He has  family history of Alzheimer's but lab and amyloid brain imaging does not suggest Alzheimer's yet.SABRA    PLAN:I had a long d/w patient and his daughter about his silent cerebellar stroke,mild cognitive impairment and family history of Alzheimer`s dementia, risk for  recurrent stroke/TIAs, personally independently reviewed imaging studies and stroke evaluation results and answered questions.Continue Plavix  75 mg daily for secondary stroke prevention and maintain strict control of hypertension with blood pressure goal below 130/90, diabetes with hemoglobin A1c goal below 6.5% and lipids with LDL cholesterol goal below 70 mg/dL. I also advised the patient to eat a healthy diet with plenty of whole grains, cereals, fruits  and vegetables, exercise regularly and maintain ideal body weight I discussed the results of his ATN profile, apoprotein E levels and PET amyloid which do not suggest high risk for Alzheimer's at this time..   Continue Namenda  10 mg twice daily to help with cognitive impairment and memory loss.  I also encouraged him to increase participation in cognitively challenging activities like solving crossword puzzles, playing bridge and sudoku.  We also discussed memory compensation strategies.  Followup in the future with me in 1 year or call earlier if necessary.    I personally spent a total of  in the care of the patient today including getting/reviewing separately obtained history, performing a medically appropriate exam/evaluation, counseling and educating, placing orders, referring and communicating with other health care professionals, documenting clinical information in the EHR, independently interpreting results, and coordinating care.           Eather Popp, MD Note: This document was prepared with digital dictation and possible smart phrase technology. Any transcriptional errors that result from this process are unintentional.

## 2023-11-22 NOTE — Patient Instructions (Signed)
 I had a long d/w patient and his daughter about his silent cerebellar stroke,mild cognitive impairment and family history of Alzheimer`s dementia, risk for recurrent stroke/TIAs, personally independently reviewed imaging studies and stroke evaluation results and answered questions.Continue Plavix  75 mg daily for secondary stroke prevention and maintain strict control of hypertension with blood pressure goal below 130/90, diabetes with hemoglobin A1c goal below 6.5% and lipids with LDL cholesterol goal below 70 mg/dL. I also advised the patient to eat a healthy diet with plenty of whole grains, cereals, fruits and vegetables, exercise regularly and maintain ideal body weight I discussed the results of his ATN profile, apoprotein E levels and PET amyloid which do not suggest high risk for Alzheimer's at this time..   Continue Namenda  10 mg twice daily to help with cognitive impairment and memory loss.  I also encouraged him to increase participation in cognitively challenging activities like solving crossword puzzles, playing bridge and sudoku.  We also discussed memory compensation strategies.  Followup in the future with me in 1 year or call earlier if necessary.  Memory Compensation Strategies  Use WARM strategy.  W= write it down  A= associate it  R= repeat it  M= make a mental note  2.   You can keep a Glass blower/designer.  Use a 3-ring notebook with sections for the following: calendar, important names and phone numbers,  medications, doctors' names/phone numbers, lists/reminders, and a section to journal what you did  each day.   3.    Use a calendar to write appointments down.  4.    Write yourself a schedule for the day.  This can be placed on the calendar or in a separate section of the Memory Notebook.  Keeping a  regular schedule can help memory.  5.    Use medication organizer with sections for each day or morning/evening pills.  You may need help loading it  6.    Keep a basket, or  pegboard by the door.  Place items that you need to take out with you in the basket or on the pegboard.  You may also want to  include a message board for reminders.  7.    Use sticky notes.  Place sticky notes with reminders in a place where the task is performed.  For example:  turn off the  stove placed by the stove, lock the door placed on the door at eye level,  take your medications on  the bathroom mirror or by the place where you normally take your medications.  8.    Use alarms/timers.  Use while cooking to remind yourself to check on food or as a reminder to take your medicine, or as a  reminder to make a call, or as a reminder to perform another task, etc.

## 2023-11-24 ENCOUNTER — Other Ambulatory Visit: Payer: Self-pay | Admitting: Gastroenterology

## 2023-11-24 DIAGNOSIS — R933 Abnormal findings on diagnostic imaging of other parts of digestive tract: Secondary | ICD-10-CM

## 2023-12-01 ENCOUNTER — Ambulatory Visit
Admission: RE | Admit: 2023-12-01 | Discharge: 2023-12-01 | Disposition: A | Discharge status: Home / Self Care | Source: Ambulatory Visit | Attending: Gastroenterology | Admitting: Gastroenterology

## 2023-12-01 DIAGNOSIS — R933 Abnormal findings on diagnostic imaging of other parts of digestive tract: Secondary | ICD-10-CM

## 2023-12-01 MED ORDER — IOPAMIDOL (ISOVUE-300) INJECTION 61%
100.0000 mL | Freq: Once | INTRAVENOUS | Status: AC | PRN
  Administered 2023-12-01: 100 mL via INTRAVENOUS

## 2023-12-07 ENCOUNTER — Other Ambulatory Visit: Payer: Self-pay | Admitting: Physician Assistant

## 2023-12-07 ENCOUNTER — Other Ambulatory Visit: Payer: Self-pay

## 2023-12-07 ENCOUNTER — Other Ambulatory Visit: Payer: Self-pay | Admitting: Neurology

## 2023-12-07 DIAGNOSIS — D508 Other iron deficiency anemias: Secondary | ICD-10-CM

## 2023-12-08 ENCOUNTER — Inpatient Hospital Stay: Attending: Physician Assistant

## 2023-12-08 ENCOUNTER — Inpatient Hospital Stay: Admitting: Physician Assistant

## 2023-12-08 VITALS — BP 142/73 | HR 101 | Temp 97.9°F | Resp 20 | Wt 120.9 lb

## 2023-12-08 DIAGNOSIS — D508 Other iron deficiency anemias: Secondary | ICD-10-CM

## 2023-12-08 DIAGNOSIS — Z800 Family history of malignant neoplasm of digestive organs: Secondary | ICD-10-CM | POA: Diagnosis not present

## 2023-12-08 DIAGNOSIS — Z87891 Personal history of nicotine dependence: Secondary | ICD-10-CM | POA: Diagnosis not present

## 2023-12-08 DIAGNOSIS — D509 Iron deficiency anemia, unspecified: Secondary | ICD-10-CM | POA: Insufficient documentation

## 2023-12-08 DIAGNOSIS — Z803 Family history of malignant neoplasm of breast: Secondary | ICD-10-CM | POA: Diagnosis not present

## 2023-12-08 DIAGNOSIS — R634 Abnormal weight loss: Secondary | ICD-10-CM | POA: Diagnosis not present

## 2023-12-08 LAB — CBC WITH DIFFERENTIAL (CANCER CENTER ONLY)
Abs Immature Granulocytes: 0.01 K/uL (ref 0.00–0.07)
Basophils Absolute: 0.1 K/uL (ref 0.0–0.1)
Basophils Relative: 1 %
Eosinophils Absolute: 0.4 K/uL (ref 0.0–0.5)
Eosinophils Relative: 7 %
HCT: 38.3 % — ABNORMAL LOW (ref 39.0–52.0)
Hemoglobin: 12.4 g/dL — ABNORMAL LOW (ref 13.0–17.0)
Immature Granulocytes: 0 %
Lymphocytes Relative: 14 %
Lymphs Abs: 0.8 K/uL (ref 0.7–4.0)
MCH: 26.9 pg (ref 26.0–34.0)
MCHC: 32.4 g/dL (ref 30.0–36.0)
MCV: 83.1 fL (ref 80.0–100.0)
Monocytes Absolute: 0.6 K/uL (ref 0.1–1.0)
Monocytes Relative: 10 %
Neutro Abs: 3.9 K/uL (ref 1.7–7.7)
Neutrophils Relative %: 68 %
Platelet Count: 342 K/uL (ref 150–400)
RBC: 4.61 MIL/uL (ref 4.22–5.81)
RDW: 22.1 % — ABNORMAL HIGH (ref 11.5–15.5)
WBC Count: 5.8 K/uL (ref 4.0–10.5)
nRBC: 0 % (ref 0.0–0.2)

## 2023-12-08 LAB — CMP (CANCER CENTER ONLY)
ALT: 12 U/L (ref 0–44)
AST: 15 U/L (ref 15–41)
Albumin: 4 g/dL (ref 3.5–5.0)
Alkaline Phosphatase: 59 U/L (ref 38–126)
Anion gap: 8 (ref 5–15)
BUN: 11 mg/dL (ref 8–23)
CO2: 29 mmol/L (ref 22–32)
Calcium: 10.5 mg/dL — ABNORMAL HIGH (ref 8.9–10.3)
Chloride: 98 mmol/L (ref 98–111)
Creatinine: 0.98 mg/dL (ref 0.61–1.24)
GFR, Estimated: >60 mL/min (ref >60)
Glucose, Bld: 194 mg/dL — ABNORMAL HIGH (ref 70–99)
Potassium: 3.9 mmol/L (ref 3.5–5.1)
Sodium: 135 mmol/L (ref 135–145)
Total Bilirubin: 0.4 mg/dL (ref 0.0–1.2)
Total Protein: 8.2 g/dL — ABNORMAL HIGH (ref 6.5–8.1)

## 2023-12-08 LAB — IRON AND IRON BINDING CAPACITY (CC-WL,HP ONLY)
Iron: 47 ug/dL (ref 45–182)
Saturation Ratios: 19 % (ref 17.9–39.5)
TIBC: 252 ug/dL (ref 250–450)
UIBC: 205 ug/dL (ref 117–376)

## 2023-12-08 LAB — FERRITIN: Ferritin: 394 ng/mL — ABNORMAL HIGH (ref 24–336)

## 2023-12-08 NOTE — Progress Notes (Deleted)
 [facility] [phone]   [phone]  PROGRESS NOTE  Patient Care Team: Mannam, Praveen, MD as PCP - General (Pulmonary Disease) Elicia Claw, MD as Consulting Physician (Gastroenterology)  CHIEF COMPLAINTS/PURPOSE OF CONSULTATION:  Iron deficiency anemia  HISTORY OF PRESENTING ILLNESS:  Jeffrey Stevens 73 y.o. male with medical history significant for DM, hyperlipidemia and hypertension presents to the hematology clinic for evaluation of iron deficiency anemia. Patient was previously seen by Dr. Sherrod last in May 2019 for the same condition. He is accompanied by his daughter for this visit.   On review of the previous records, there is evidence of iron deficiency as far back as April 2015 where he received periodic IV iron infusion and has been on long term PO iron therapy. Most recent labs from 10/12/2023 showed Hgb 7.0, MCV 86.4, ferritin 8.   On exam today, Jeffrey Stevens reports having progressive fatigue over the past severy months which has impacted his ADLs. He reports more noticeable shortness of breath on exertion and at rest. He does crave ice throughout the day. He denies overt signs of bleeding including hematochezia or melena. He underwent reports undergoing EGD and colonoscopy last month with GI without any evidence of GI bleeding. He denies fevers, chills, sweats, chest pain, cough, headaches or dizziness. He has no other complaints. Rest of the ROS is below.   MEDICAL HISTORY:  Past Medical History:  Diagnosis Date   Diabetes mellitus without complication (HCC)    Dyslipidemia    Hypertension    Pneumonia 05/23/2014    SURGICAL HISTORY: Past Surgical History:  Procedure Laterality Date   COLONOSCOPY  2015    SOCIAL HISTORY: Social History   Socioeconomic History   Marital status: Legally Separated    Spouse name: Not on file   Number of children: Not on file   Years of education: Not on file   Highest education level:  Not on file  Occupational History   Not on file  Tobacco Use   Smoking status: Former    Current packs/day: 1.00    Average packs/day: 1 pack/day for 50.0 years (50.0 ttl pk-yrs)    Types: Cigarettes   Smokeless tobacco: Never   Tobacco comments:    Quit smoking early 2025.   Vaping Use   Vaping status: Never Used  Substance and Sexual Activity   Alcohol use: No   Drug use: No   Sexual activity: Not on file  Other Topics Concern   Not on file  Social History Narrative   Pt lives with family    Retired    Social Drivers of Corporate investment banker Strain: Not on file  Food Insecurity: No Food Insecurity (10/20/2023)   Hunger Vital Sign    Worried About Running Out of Food in the Last Year: Never true    Ran Out of Food in the Last Year: Never true  Transportation Needs: No Transportation Needs (10/20/2023)   PRAPARE - Administrator, Civil Service (Medical): No    Lack of Transportation (Non-Medical): No  Physical Activity: Not on file  Stress: Not on file  Social Connections: Socially Isolated (05/05/2023)   Social Connection and Isolation Panel    Frequency of Communication with Friends and Family: Twice a week    Frequency of Social Gatherings with Friends and Family: Three times a week    Attends Religious Services: Never    Active Member of Clubs or Organizations: No    Attends  Club or Organization Meetings: Never    Marital Status: Separated  Intimate Partner Violence: Not At Risk (10/20/2023)   Humiliation, Afraid, Rape, and Kick questionnaire    Fear of Current or Ex-Partner: No    Emotionally Abused: No    Physically Abused: No    Sexually Abused: No    FAMILY HISTORY: Family History  Problem Relation Age of Onset   Diabetes Mother    Diabetes Father    Pancreatic cancer Father    Breast cancer Sister    Alzheimer's disease Sister    Alzheimer's disease Paternal Aunt     ALLERGIES:  has no known allergies.  MEDICATIONS:  Current  Outpatient Medications  Medication Sig Dispense Refill   ACCU-CHEK SOFTCLIX LANCETS lancets      BD PEN NEEDLE NANO U/F 32G X 4 MM MISC See admin instructions.  4   Budeson-Glycopyrrol-Formoterol  (BREZTRI AEROSPHERE) 160-9-4.8 MCG/ACT AERO Inhale 2 puffs into the lungs at bedtime.     clopidogrel  (PLAVIX ) 75 MG tablet Take 1 tablet (75 mg total) by mouth daily. 30 tablet 3   Continuous Glucose Sensor (FREESTYLE LIBRE 3 PLUS SENSOR) MISC by Does not apply route. Change sensor every 15 days.     ferrous sulfate 325 (65 FE) MG tablet Take 325 mg by mouth daily with breakfast.     insulin  glargine, 1 Unit Dial, (TOUJEO  SOLOSTAR) 300 UNIT/ML Solostar Pen Inject 30 Units into the skin at bedtime.     JANUMET 50-1000 MG tablet Take 1 tablet by mouth 2 (two) times daily.     lisinopril -hydrochlorothiazide  (PRINZIDE ,ZESTORETIC ) 20-12.5 MG per tablet Take 1 tablet by mouth daily. 30 tablet 0   memantine  (NAMENDA ) 10 MG tablet Take 1 tablet (10 mg total) by mouth 2 (two) times daily. 60 tablet 5   mirtazapine (REMERON) 7.5 MG tablet Take 7.5 mg by mouth at bedtime.     Multiple Vitamin (MULTIVITAMIN WITH MINERALS) TABS tablet Take 1 tablet by mouth daily.     ONE TOUCH ULTRA TEST test strip      pioglitazone (ACTOS) 15 MG tablet Take 15 mg by mouth daily.     rosuvastatin  (CRESTOR ) 20 MG tablet Take 1 tablet (20 mg total) by mouth daily. 30 tablet 1   memantine  (NAMENDA  TITRATION PAK) tablet pack 5 mg/day for =1 week; 5 mg twice daily for =1 week; 15 mg/day given in 5 mg and 10 mg separated doses for =1 week; then 10 mg twice daily 49 tablet 12   Current Facility-Administered Medications  Medication Dose Route Frequency Provider Last Rate Last Admin   acetaminophen  (TYLENOL ) tablet 975 mg  975 mg Oral Once Copland, Harlene BROCKS, MD        REVIEW OF SYSTEMS:   Constitutional: ( - ) fevers, ( - )  chills , ( - ) night sweats Eyes: ( - ) blurriness of vision, ( - ) double vision, ( - ) watery eyes Ears,  nose, mouth, throat, and face: ( - ) mucositis, ( - ) sore throat Respiratory: ( - ) cough, ( - ) dyspnea, ( - ) wheezes Cardiovascular: ( - ) palpitation, ( - ) chest discomfort, ( - ) lower extremity swelling Gastrointestinal:  ( - ) nausea, ( - ) heartburn, ( - ) change in bowel habits Skin: ( - ) abnormal skin rashes Lymphatics: ( - ) new lymphadenopathy, ( - ) easy bruising Neurological: ( - ) numbness, ( - ) tingling, ( - ) new weaknesses Behavioral/Psych: ( - )  mood change, ( - ) new changes  All other systems were reviewed with the patient and are negative.  PHYSICAL EXAMINATION: ECOG PERFORMANCE STATUS: 1 - Symptomatic but completely ambulatory  Vitals:   12/08/23 1050  BP: (!) 142/73  Pulse: (!) 101  Resp: 20  Temp: 97.9 F (36.6 C)  SpO2: 98%   Filed Weights   12/08/23 1050  Weight: 120 lb 14.4 oz (54.8 kg)    GENERAL: well appearing male in NAD  SKIN: skin color, texture, turgor are normal, no rashes or significant lesions EYES: conjunctiva are pink and non-injected, sclera clear LUNGS: clear to auscultation and percussion with normal breathing effort HEART: regular rate & rhythm and no murmurs and no lower extremity edema Musculoskeletal: no cyanosis of digits and no clubbing  PSYCH: alert & oriented x 3, fluent speech NEURO: no focal motor/sensory deficits  LABORATORY DATA:  I have reviewed the data as listed    Latest Ref Rng & Units 12/08/2023   10:25 AM 11/10/2023   11:59 AM 10/20/2023   12:29 PM  CBC  WBC 4.0 - 10.5 K/uL 5.8  5.7  4.9   Hemoglobin 13.0 - 17.0 g/dL 87.5  89.6  7.0   Hematocrit 39.0 - 52.0 % 38.3  32.7  22.7   Platelets 150 - 400 K/uL 342  271  301        Latest Ref Rng & Units 11/10/2023   11:59 AM 10/20/2023   12:29 PM 05/06/2023    5:50 AM  CMP  Glucose 70 - 99 mg/dL 787  745  746   BUN 8 - 23 mg/dL 11  14  11    Creatinine 0.61 - 1.24 mg/dL 8.99  8.99  9.12   Sodium 135 - 145 mmol/L 130  126  138   Potassium 3.5 - 5.1 mmol/L  4.3  4.6  3.6   Chloride 98 - 111 mmol/L 96  92  106   CO2 22 - 32 mmol/L 28  26  23    Calcium  8.9 - 10.3 mg/dL 89.9  9.5  8.9   Total Protein 6.5 - 8.1 g/dL 7.6  7.3    Total Bilirubin 0.0 - 1.2 mg/dL 0.4  0.3    Alkaline Phos 38 - 126 U/L 51  41    AST 15 - 41 U/L 14  12    ALT 0 - 44 U/L 13  12      RADIOGRAPHIC STUDIES: I have personally reviewed the radiological images as listed and agreed with the findings in the report. CT ENTERO ABD/PELVIS W CONTAST Result Date: 12/01/2023 CLINICAL DATA:  rountine.  Iron deficiency anemia. EXAM: CT ABDOMEN AND PELVIS WITH CONTRAST (ENTEROGRAPHY) TECHNIQUE: Multidetector CT of the abdomen and pelvis during bolus administration of intravenous contrast. Negative oral contrast was given. RADIATION DOSE REDUCTION: This exam was performed according to the departmental dose-optimization program which includes automated exposure control, adjustment of the mA and/or kV according to patient size and/or use of iterative reconstruction technique. CONTRAST:  ISOVUE-300 IOPAMIDOL (ISOVUE-300) INJECTION 61% COMPARISON:  CT scan abdomen and pelvis report from 04/19/2001. Please note images are not available for review. FINDINGS: Lower chest: Mild emphysematous changes noted in the visualized lung bases. The lung bases are otherwise clear. No pleural effusion. The heart is normal in size. No pericardial effusion. Hepatobiliary: The liver is normal in size. Non-cirrhotic configuration. No suspicious mass. No intrahepatic or extrahepatic bile duct dilation. No calcified gallstones. Normal gallbladder wall thickness. No pericholecystic inflammatory  changes. Pancreas: There is asymmetric atrophy, hypoenhancement in multiple dystrophic calcifications within the pancreatic tail. Findings likely represent sequela of chronic pancreatitis. The images are not available from prior study from 2003 however, there is description of similar findings. Pancreatic head, uncinate process,  neck and body appears within normal limits. No focal mass. No peripancreatic fat stranding. Main pancreatic duct is not dilated. Spleen: Within normal limits. No focal lesion. Adrenals/Urinary Tract: Adrenal glands are unremarkable. No suspicious renal mass. No hydronephrosis. No renal or ureteric calculi. Urinary bladder is under distended, precluding optimal assessment. However, no large mass or stones identified. No perivesical fat stranding. Stomach/Bowel: No disproportionate dilation of the small or large bowel loops. Unremarkable appendix. There is mild to moderate irregular asymmetric thickening of the left wall of the cecum (series 4, image 38), which may be artifactual due to underdistention. However, underlying nonobstructing lesion can not be excluded on this exam correlate with recent colonoscopy results. There is also short segment (approximately 3 cm), of proximal sigmoid colon exhibiting mild to moderate irregular circumferential wall thickening, up to 7 mm (series 2, image 35 and series 4, image 40). There is no surrounding fat stranding or proximal bowel obstruction. No associated abnormal lymphadenopathy. Finding is nonspecific and likely due to underdistention/focal contraction. Correlate with recent colonoscopy. Vascular/Lymphatic: No ascites or pneumoperitoneum. No abdominal or pelvic lymphadenopathy, by size criteria. No aneurysmal dilation of the major abdominal arteries. There are mild peripheral atherosclerotic vascular calcifications of the aorta and its major branches. Reproductive: Normal size prostate. Symmetric seminal vesicles. Other: There is a tiny fat containing umbilical hernia. The soft tissues and abdominal wall are otherwise unremarkable. Musculoskeletal: No suspicious osseous lesions. There are mild multilevel degenerative changes in the visualized spine. IMPRESSION: 1. There is mild-to-moderate irregular asymmetric thickening of the left wall of the cecum, which may be  artifactual due to underdistention. However, underlying nonobstructing lesion can not be excluded on this exam. Correlate with recent colonoscopy results. There is also short segment of proximal sigmoid colon exhibiting mild-to-moderate irregular circumferential wall thickening, likely due to underdistention/focal contraction. No lymphadenopathy by size criteria. 2. Multiple other nonacute observations, as described above. Aortic Atherosclerosis (ICD10-I70.0). Electronically Signed   By: Ree Molt M.D.   On: 12/01/2023 16:24    ASSESSMENT & PLAN SKIP LITKE is a 73 y.o. male who presents to the clinic for evaluation of iron deficiency anemia.   # Iron Deficiency Anemia, etiology unknown --Underwent colonoscopy on 08/28/2023 wit Dr. Elicia with no source for iron deficiency. Will request follow up with Dr. Elicia for EGD +/- capsular endoscopy.  --We will confirm iron deficiency anemia by ordering iron panel and ferritin as well as reticulocytes, CBC, and CMP --Continue ferrous sulfate 325 mg daily with a source of vitamin C --We will plan to proceed with IV iron therapy at [employer] infusion --RTC in 4 weeks for lab check and 8 weeks for labs/follow up  **ADDENDUM: Labs from today confirmed acute anemia with Hgb 7.0. Patient will receive 1 unit of PRBC today.   No orders of the defined types were placed in this encounter.   All questions were answered. The patient knows to call the clinic with any problems, questions or concerns.  I have spent a total of 60 minutes minutes of face-to-face and non-face-to-face time, preparing to see the patient, obtaining and/or reviewing separately obtained history, performing a medically appropriate examination, counseling and educating the patient, ordering medications/tests/procedures, referring and communicating with other health care professionals, documenting  clinical information in the electronic health record, independently  interpreting results and communicating results to the patient, and care coordination.   Jeffrey Police, PA-C Department of Hematology/Oncology [facility] at [facility] Phone: [phone]  Patient was seen with Dr. Federico  I have read the above note and personally examined the patient. I agree with the assessment and plan as noted above.  Briefly Jeffrey Stevens is a 73 year old male who presents for evaluation of iron deficiency anemia.  He was previously seen in 2019 by one of our other providers for the same condition.  On most recent check earlier this month he was found to have a ferritin of 8 with a hemoglobin of 7.0.  Due to concern for these findings the patient was referred to our service for further evaluation and management.  At this time findings appear most consistent with iron deficiency secondary to GI bleeding.  He follows with  Dr. Elicia and has undergone colonoscopy, but we would recommend further evaluation with EGD and consideration of capsule endoscopy if a clear etiology cannot be found.  In the interim we will order IV iron therapy in order to help bolster his blood counts.  Additionally because his hemoglobin is still low we will send a sample to blood bank for preparation in case he would require a blood transfusion.   Norleen IVAR Federico, MD Department of Hematology/Oncology [facility] Phone: [phone] Pager: [phone] Email: [email or web address]

## 2023-12-08 NOTE — Progress Notes (Signed)
 [facility] [phone]   [phone]  PROGRESS NOTE  Patient Care Team: Mannam, Praveen, MD as PCP - General (Pulmonary Disease) Elicia Claw, MD as Consulting Physician (Gastroenterology)  CHIEF COMPLAINTS/PURPOSE OF CONSULTATION:  Iron deficiency anemia  HISTORY OF PRESENTING ILLNESS:  Jeffrey Stevens 73 y.o. male returns for a follow up for iron deficiency anemia. He was last seen on 10/19/2023 to establish care. In the interim, he received IV feraheme  x 2 doses. He is accompanied by his daughter for this visit.   On exam today, Jeffrey Stevens reports his energy levels have improved since receiving IV iron. He tolerated IV iron without any side effects. He denies any overt signs of bleeding including hematochezia or melena. He is undergoing additional GI workup with recent capsular EGD and repeat colonoscopy in the near future. He denies fevers, chills, sweats, chest pain, cough, headaches or dizziness. He has no other complaints. Rest of the ROS is below.   MEDICAL HISTORY:  Past Medical History:  Diagnosis Date   Diabetes mellitus without complication (HCC)    Dyslipidemia    Hypertension    Pneumonia 05/23/2014    SURGICAL HISTORY: Past Surgical History:  Procedure Laterality Date   COLONOSCOPY  2015    SOCIAL HISTORY: Social History   Socioeconomic History   Marital status: Legally Separated    Spouse name: Not on file   Number of children: Not on file   Years of education: Not on file   Highest education level: Not on file  Occupational History   Not on file  Tobacco Use   Smoking status: Former    Current packs/day: 1.00    Average packs/day: 1 pack/day for 50.0 years (50.0 ttl pk-yrs)    Types: Cigarettes   Smokeless tobacco: Never   Tobacco comments:    Quit smoking early 2025.   Vaping Use   Vaping status: Never Used  Substance and Sexual Activity   Alcohol use: No   Drug use: No   Sexual activity: Not on file   Other Topics Concern   Not on file  Social History Narrative   Pt lives with family    Retired    Social Drivers of Corporate investment banker Strain: Not on file  Food Insecurity: No Food Insecurity (10/20/2023)   Hunger Vital Sign    Worried About Running Out of Food in the Last Year: Never true    Ran Out of Food in the Last Year: Never true  Transportation Needs: No Transportation Needs (10/20/2023)   PRAPARE - Administrator, Civil Service (Medical): No    Lack of Transportation (Non-Medical): No  Physical Activity: Not on file  Stress: Not on file  Social Connections: Socially Isolated (05/05/2023)   Social Connection and Isolation Panel    Frequency of Communication with Friends and Family: Twice a week    Frequency of Social Gatherings with Friends and Family: Three times a week    Attends Religious Services: Never    Active Member of Clubs or Organizations: No    Attends Banker Meetings: Never    Marital Status: Separated  Intimate Partner Violence: Not At Risk (10/20/2023)   Humiliation, Afraid, Rape, and Kick questionnaire    Fear of Current or Ex-Partner: No    Emotionally Abused: No    Physically Abused: No    Sexually Abused: No    FAMILY HISTORY: Family History  Problem Relation Age of Onset  Diabetes Mother    Diabetes Father    Pancreatic cancer Father    Breast cancer Sister    Alzheimer's disease Sister    Alzheimer's disease Paternal Aunt     ALLERGIES:  has no known allergies.  MEDICATIONS:  Current Outpatient Medications  Medication Sig Dispense Refill   ACCU-CHEK SOFTCLIX LANCETS lancets      BD PEN NEEDLE NANO U/F 32G X 4 MM MISC See admin instructions.  4   Budeson-Glycopyrrol-Formoterol  (BREZTRI AEROSPHERE) 160-9-4.8 MCG/ACT AERO Inhale 2 puffs into the lungs at bedtime.     clopidogrel  (PLAVIX ) 75 MG tablet Take 1 tablet (75 mg total) by mouth daily. 30 tablet 3   Continuous Glucose Sensor (FREESTYLE LIBRE 3  PLUS SENSOR) MISC by Does not apply route. Change sensor every 15 days.     ferrous sulfate 325 (65 FE) MG tablet Take 325 mg by mouth daily with breakfast.     insulin  glargine, 1 Unit Dial, (TOUJEO  SOLOSTAR) 300 UNIT/ML Solostar Pen Inject 30 Units into the skin at bedtime.     JANUMET 50-1000 MG tablet Take 1 tablet by mouth 2 (two) times daily.     lisinopril -hydrochlorothiazide  (PRINZIDE ,ZESTORETIC ) 20-12.5 MG per tablet Take 1 tablet by mouth daily. 30 tablet 0   memantine  (NAMENDA ) 10 MG tablet Take 1 tablet (10 mg total) by mouth 2 (two) times daily. 60 tablet 5   mirtazapine (REMERON) 7.5 MG tablet Take 7.5 mg by mouth at bedtime.     Multiple Vitamin (MULTIVITAMIN WITH MINERALS) TABS tablet Take 1 tablet by mouth daily.     ONE TOUCH ULTRA TEST test strip      pioglitazone (ACTOS) 15 MG tablet Take 15 mg by mouth daily.     rosuvastatin  (CRESTOR ) 20 MG tablet Take 1 tablet (20 mg total) by mouth daily. 30 tablet 1   memantine  (NAMENDA  TITRATION PAK) tablet pack 5 mg/day for =1 week; 5 mg twice daily for =1 week; 15 mg/day given in 5 mg and 10 mg separated doses for =1 week; then 10 mg twice daily 49 tablet 12   Current Facility-Administered Medications  Medication Dose Route Frequency Provider Last Rate Last Admin   acetaminophen  (TYLENOL ) tablet 975 mg  975 mg Oral Once Copland, Harlene BROCKS, MD        REVIEW OF SYSTEMS:   Constitutional: ( - ) fevers, ( - )  chills , ( - ) night sweats Eyes: ( - ) blurriness of vision, ( - ) double vision, ( - ) watery eyes Ears, nose, mouth, throat, and face: ( - ) mucositis, ( - ) sore throat Respiratory: ( - ) cough, ( - ) dyspnea, ( - ) wheezes Cardiovascular: ( - ) palpitation, ( - ) chest discomfort, ( - ) lower extremity swelling Gastrointestinal:  ( - ) nausea, ( - ) heartburn, ( - ) change in bowel habits Skin: ( - ) abnormal skin rashes Lymphatics: ( - ) new lymphadenopathy, ( - ) easy bruising Neurological: ( - ) numbness, ( - )  tingling, ( - ) new weaknesses Behavioral/Psych: ( - ) mood change, ( - ) new changes  All other systems were reviewed with the patient and are negative.  PHYSICAL EXAMINATION: ECOG PERFORMANCE STATUS: 1 - Symptomatic but completely ambulatory  Vitals:   12/08/23 1050  BP: (!) 142/73  Pulse: (!) 101  Resp: 20  Temp: 97.9 F (36.6 C)  SpO2: 98%   Filed Weights   12/08/23 1050  Weight: 120  lb 14.4 oz (54.8 kg)    GENERAL: well appearing male in NAD  SKIN: skin color, texture, turgor are normal, no rashes or significant lesions EYES: conjunctiva are pink and non-injected, sclera clear LUNGS: clear to auscultation and percussion with normal breathing effort HEART: regular rate & rhythm and no murmurs and no lower extremity edema Musculoskeletal: no cyanosis of digits and no clubbing  PSYCH: alert & oriented x 3, fluent speech NEURO: no focal motor/sensory deficits  LABORATORY DATA:  I have reviewed the data as listed    Latest Ref Rng & Units 12/08/2023   10:25 AM 11/10/2023   11:59 AM 10/20/2023   12:29 PM  CBC  WBC 4.0 - 10.5 K/uL 5.8  5.7  4.9   Hemoglobin 13.0 - 17.0 g/dL 87.5  89.6  7.0   Hematocrit 39.0 - 52.0 % 38.3  32.7  22.7   Platelets 150 - 400 K/uL 342  271  301        Latest Ref Rng & Units 12/08/2023   10:25 AM 11/10/2023   11:59 AM 10/20/2023   12:29 PM  CMP  Glucose 70 - 99 mg/dL 805  787  745   BUN 8 - 23 mg/dL 11  11  14    Creatinine 0.61 - 1.24 mg/dL 9.01  8.99  8.99   Sodium 135 - 145 mmol/L 135  130  126   Potassium 3.5 - 5.1 mmol/L 3.9  4.3  4.6   Chloride 98 - 111 mmol/L 98  96  92   CO2 22 - 32 mmol/L 29  28  26    Calcium  8.9 - 10.3 mg/dL 89.4  89.9  9.5   Total Protein 6.5 - 8.1 g/dL 8.2  7.6  7.3   Total Bilirubin 0.0 - 1.2 mg/dL 0.4  0.4  0.3   Alkaline Phos 38 - 126 U/L 59  51  41   AST 15 - 41 U/L 15  14  12    ALT 0 - 44 U/L 12  13  12      RADIOGRAPHIC STUDIES: I have personally reviewed the radiological images as listed and  agreed with the findings in the report. CT ENTERO ABD/PELVIS W CONTAST Result Date: 12/01/2023 CLINICAL DATA:  rountine.  Iron deficiency anemia. EXAM: CT ABDOMEN AND PELVIS WITH CONTRAST (ENTEROGRAPHY) TECHNIQUE: Multidetector CT of the abdomen and pelvis during bolus administration of intravenous contrast. Negative oral contrast was given. RADIATION DOSE REDUCTION: This exam was performed according to the departmental dose-optimization program which includes automated exposure control, adjustment of the mA and/or kV according to patient size and/or use of iterative reconstruction technique. CONTRAST:  ISOVUE-300 IOPAMIDOL (ISOVUE-300) INJECTION 61% COMPARISON:  CT scan abdomen and pelvis report from 04/19/2001. Please note images are not available for review. FINDINGS: Lower chest: Mild emphysematous changes noted in the visualized lung bases. The lung bases are otherwise clear. No pleural effusion. The heart is normal in size. No pericardial effusion. Hepatobiliary: The liver is normal in size. Non-cirrhotic configuration. No suspicious mass. No intrahepatic or extrahepatic bile duct dilation. No calcified gallstones. Normal gallbladder wall thickness. No pericholecystic inflammatory changes. Pancreas: There is asymmetric atrophy, hypoenhancement in multiple dystrophic calcifications within the pancreatic tail. Findings likely represent sequela of chronic pancreatitis. The images are not available from prior study from 2003 however, there is description of similar findings. Pancreatic head, uncinate process, neck and body appears within normal limits. No focal mass. No peripancreatic fat stranding. Main pancreatic duct is not dilated. Spleen:  Within normal limits. No focal lesion. Adrenals/Urinary Tract: Adrenal glands are unremarkable. No suspicious renal mass. No hydronephrosis. No renal or ureteric calculi. Urinary bladder is under distended, precluding optimal assessment. However, no large mass or  stones identified. No perivesical fat stranding. Stomach/Bowel: No disproportionate dilation of the small or large bowel loops. Unremarkable appendix. There is mild to moderate irregular asymmetric thickening of the left wall of the cecum (series 4, image 38), which may be artifactual due to underdistention. However, underlying nonobstructing lesion can not be excluded on this exam correlate with recent colonoscopy results. There is also short segment (approximately 3 cm), of proximal sigmoid colon exhibiting mild to moderate irregular circumferential wall thickening, up to 7 mm (series 2, image 35 and series 4, image 40). There is no surrounding fat stranding or proximal bowel obstruction. No associated abnormal lymphadenopathy. Finding is nonspecific and likely due to underdistention/focal contraction. Correlate with recent colonoscopy. Vascular/Lymphatic: No ascites or pneumoperitoneum. No abdominal or pelvic lymphadenopathy, by size criteria. No aneurysmal dilation of the major abdominal arteries. There are mild peripheral atherosclerotic vascular calcifications of the aorta and its major branches. Reproductive: Normal size prostate. Symmetric seminal vesicles. Other: There is a tiny fat containing umbilical hernia. The soft tissues and abdominal wall are otherwise unremarkable. Musculoskeletal: No suspicious osseous lesions. There are mild multilevel degenerative changes in the visualized spine. IMPRESSION: 1. There is mild-to-moderate irregular asymmetric thickening of the left wall of the cecum, which may be artifactual due to underdistention. However, underlying nonobstructing lesion can not be excluded on this exam. Correlate with recent colonoscopy results. There is also short segment of proximal sigmoid colon exhibiting mild-to-moderate irregular circumferential wall thickening, likely due to underdistention/focal contraction. No lymphadenopathy by size criteria. 2. Multiple other nonacute observations,  as described above. Aortic Atherosclerosis (ICD10-I70.0). Electronically Signed   By: Ree Molt M.D.   On: 12/01/2023 16:24    ASSESSMENT & PLAN Jeffrey Stevens is a 73 y.o. male who presents to the clinic for evaluation of iron deficiency anemia.   # Iron Deficiency Anemia, etiology unknown --Underwent EGD/colonoscopy on 08/28/2023 with Dr. Elicia was incomplete due to large amount of stool in the entire colon, no definitive source for iron deficiency. EGD showed non-obstructing schatzki ring, small hiatal hernia, single non-bleeding angioectasia in duodenum.  --He underwent capsular endoscopy a couple of weeks ago with results pending. I will request report.  --CT entero abdomen/pelvis from 12/01/23 showed  mild-to-moderate irregular asymmetric thickening of the left wall of the cecum. He is planning to undergo repeat colonoscopy to further evaluate.  --Last received IV feraheme  510 mg x 2 doses from 11/02/2023-11/09/2023.  --Labs today show improvement of anemia with Hgb 12.4, MCV 83.1. Iron panel is pending.  --Continue ferrous sulfate 325 mg daily with a source of vitamin C --Will determine if additional IV iron is needed based on pending iron levels. --RTC in 3 months for lab check and 6 months for labs/follow up.   #Weight loss: --Encouraged to eat small, frequent meals and supplement with protein shakes    No orders of the defined types were placed in this encounter.   All questions were answered. The patient knows to call the clinic with any problems, questions or concerns.  I have spent a total of 25 minutes minutes of face-to-face and non-face-to-face time, preparing to see the patient,  performing a medically appropriate examination, counseling and educating the patient, ordering medications/tests/procedures, documenting clinical information in the electronic health record, independently interpreting results and communicating  results to the patient, and care coordination.    Johnston Police, PA-C Department of Hematology/Oncology [facility] Phone: [phone]

## 2023-12-12 ENCOUNTER — Ambulatory Visit: Payer: Self-pay | Admitting: Physician Assistant

## 2024-01-03 ENCOUNTER — Encounter: Payer: Self-pay | Admitting: Gastroenterology

## 2024-01-29 ENCOUNTER — Other Ambulatory Visit: Payer: Self-pay | Admitting: Neurology

## 2024-01-30 NOTE — Telephone Encounter (Signed)
 Last seen on 11/22/23 per note  Continue Namenda  10 mg twice daily to help with cognitive impairment and memory loss.   Follow up scheduled on 11/21/24

## 2024-03-08 ENCOUNTER — Inpatient Hospital Stay: Attending: Pulmonary Disease

## 2024-06-07 ENCOUNTER — Inpatient Hospital Stay: Admitting: Physician Assistant

## 2024-06-07 ENCOUNTER — Inpatient Hospital Stay

## 2024-11-21 ENCOUNTER — Ambulatory Visit: Admitting: Neurology
